# Patient Record
Sex: Female | Born: 1975 | Race: Black or African American | Hispanic: No | Marital: Married | Smoking: Never smoker
Health system: Southern US, Community
[De-identification: ages and names within clinical notes are randomized; demographics above are authoritative.]

## PROBLEM LIST (undated history)

## (undated) DIAGNOSIS — F32A Depression, unspecified: Secondary | ICD-10-CM

## (undated) DIAGNOSIS — K219 Gastro-esophageal reflux disease without esophagitis: Secondary | ICD-10-CM

## (undated) DIAGNOSIS — R569 Unspecified convulsions: Secondary | ICD-10-CM

## (undated) DIAGNOSIS — M1990 Unspecified osteoarthritis, unspecified site: Secondary | ICD-10-CM

## (undated) DIAGNOSIS — R519 Headache, unspecified: Secondary | ICD-10-CM

## (undated) DIAGNOSIS — F4310 Post-traumatic stress disorder, unspecified: Secondary | ICD-10-CM

## (undated) HISTORY — PX: CERVICAL SPINE SURGERY: SHX589

## (undated) HISTORY — PX: BACK SURGERY: SHX140

## (undated) HISTORY — PX: TONSILLECTOMY: SUR1361

## (undated) HISTORY — PX: LASIK: SHX215

## (undated) HISTORY — PX: TUBAL LIGATION: SHX77

## (undated) HISTORY — PX: COLONOSCOPY: SHX174

## (undated) HISTORY — PX: DILATION AND CURETTAGE OF UTERUS: SHX78

---

## 1998-07-13 ENCOUNTER — Emergency Department (HOSPITAL_COMMUNITY): Admission: EM | Admit: 1998-07-13 | Discharge: 1998-07-13 | Payer: Self-pay

## 1998-12-17 ENCOUNTER — Other Ambulatory Visit: Admission: RE | Admit: 1998-12-17 | Discharge: 1998-12-17 | Payer: Self-pay | Admitting: *Deleted

## 1999-10-28 ENCOUNTER — Ambulatory Visit (HOSPITAL_COMMUNITY): Admission: RE | Admit: 1999-10-28 | Discharge: 1999-10-28 | Payer: Self-pay | Admitting: *Deleted

## 2000-04-29 ENCOUNTER — Emergency Department (HOSPITAL_COMMUNITY): Admission: EM | Admit: 2000-04-29 | Discharge: 2000-04-29 | Payer: Self-pay | Admitting: Emergency Medicine

## 2000-04-29 ENCOUNTER — Encounter: Payer: Self-pay | Admitting: Emergency Medicine

## 2002-10-14 ENCOUNTER — Encounter: Payer: Self-pay | Admitting: Obstetrics and Gynecology

## 2002-10-14 ENCOUNTER — Ambulatory Visit (HOSPITAL_COMMUNITY): Admission: RE | Admit: 2002-10-14 | Discharge: 2002-10-14 | Payer: Self-pay | Admitting: Obstetrics and Gynecology

## 2002-12-27 ENCOUNTER — Encounter: Payer: Self-pay | Admitting: Obstetrics and Gynecology

## 2002-12-27 ENCOUNTER — Ambulatory Visit (HOSPITAL_COMMUNITY): Admission: RE | Admit: 2002-12-27 | Discharge: 2002-12-27 | Payer: Self-pay | Admitting: Obstetrics and Gynecology

## 2003-01-08 ENCOUNTER — Inpatient Hospital Stay (HOSPITAL_COMMUNITY): Admission: AD | Admit: 2003-01-08 | Discharge: 2003-01-08 | Payer: Self-pay | Admitting: Obstetrics and Gynecology

## 2003-01-17 ENCOUNTER — Inpatient Hospital Stay (HOSPITAL_COMMUNITY): Admission: AD | Admit: 2003-01-17 | Discharge: 2003-01-17 | Payer: Self-pay | Admitting: Obstetrics and Gynecology

## 2003-01-17 ENCOUNTER — Encounter: Payer: Self-pay | Admitting: Obstetrics and Gynecology

## 2003-01-19 ENCOUNTER — Inpatient Hospital Stay (HOSPITAL_COMMUNITY): Admission: AD | Admit: 2003-01-19 | Discharge: 2003-01-19 | Payer: Self-pay | Admitting: Obstetrics and Gynecology

## 2003-01-19 ENCOUNTER — Encounter: Payer: Self-pay | Admitting: Obstetrics and Gynecology

## 2003-02-14 ENCOUNTER — Inpatient Hospital Stay (HOSPITAL_COMMUNITY): Admission: AD | Admit: 2003-02-14 | Discharge: 2003-02-17 | Payer: Self-pay | Admitting: Obstetrics and Gynecology

## 2003-02-21 ENCOUNTER — Inpatient Hospital Stay (HOSPITAL_COMMUNITY): Admission: AD | Admit: 2003-02-21 | Discharge: 2003-02-21 | Payer: Self-pay | Admitting: Obstetrics and Gynecology

## 2003-03-28 ENCOUNTER — Other Ambulatory Visit: Admission: RE | Admit: 2003-03-28 | Discharge: 2003-03-28 | Payer: Self-pay | Admitting: Obstetrics and Gynecology

## 2004-05-04 ENCOUNTER — Other Ambulatory Visit: Admission: RE | Admit: 2004-05-04 | Discharge: 2004-05-04 | Payer: Self-pay | Admitting: Obstetrics and Gynecology

## 2013-07-18 ENCOUNTER — Other Ambulatory Visit: Payer: Self-pay | Admitting: Family

## 2013-09-27 ENCOUNTER — Other Ambulatory Visit (HOSPITAL_COMMUNITY): Payer: Self-pay | Admitting: Obstetrics

## 2013-09-27 DIAGNOSIS — N938 Other specified abnormal uterine and vaginal bleeding: Secondary | ICD-10-CM

## 2013-09-27 DIAGNOSIS — N949 Unspecified condition associated with female genital organs and menstrual cycle: Secondary | ICD-10-CM

## 2013-10-03 ENCOUNTER — Ambulatory Visit (HOSPITAL_COMMUNITY)
Admission: RE | Admit: 2013-10-03 | Discharge: 2013-10-03 | Disposition: A | Discharge status: Home / Self Care | Payer: Medicaid Other | Source: Ambulatory Visit | Attending: Obstetrics | Admitting: Obstetrics

## 2013-10-03 DIAGNOSIS — N925 Other specified irregular menstruation: Secondary | ICD-10-CM | POA: Insufficient documentation

## 2013-10-03 DIAGNOSIS — N938 Other specified abnormal uterine and vaginal bleeding: Secondary | ICD-10-CM | POA: Insufficient documentation

## 2013-10-03 DIAGNOSIS — D252 Subserosal leiomyoma of uterus: Secondary | ICD-10-CM | POA: Insufficient documentation

## 2013-10-03 DIAGNOSIS — D251 Intramural leiomyoma of uterus: Secondary | ICD-10-CM | POA: Insufficient documentation

## 2013-10-03 DIAGNOSIS — N949 Unspecified condition associated with female genital organs and menstrual cycle: Secondary | ICD-10-CM | POA: Insufficient documentation

## 2013-12-02 ENCOUNTER — Other Ambulatory Visit (HOSPITAL_COMMUNITY): Payer: Self-pay | Admitting: Obstetrics

## 2013-12-02 DIAGNOSIS — N939 Abnormal uterine and vaginal bleeding, unspecified: Secondary | ICD-10-CM

## 2013-12-05 ENCOUNTER — Ambulatory Visit (HOSPITAL_COMMUNITY)
Admission: RE | Admit: 2013-12-05 | Discharge: 2013-12-05 | Disposition: A | Discharge status: Home / Self Care | Payer: Medicaid Other | Source: Ambulatory Visit | Attending: Obstetrics | Admitting: Obstetrics

## 2013-12-05 DIAGNOSIS — D259 Leiomyoma of uterus, unspecified: Secondary | ICD-10-CM | POA: Diagnosis not present

## 2013-12-05 DIAGNOSIS — N949 Unspecified condition associated with female genital organs and menstrual cycle: Secondary | ICD-10-CM | POA: Diagnosis not present

## 2013-12-05 DIAGNOSIS — N925 Other specified irregular menstruation: Secondary | ICD-10-CM | POA: Insufficient documentation

## 2013-12-05 DIAGNOSIS — N938 Other specified abnormal uterine and vaginal bleeding: Secondary | ICD-10-CM | POA: Insufficient documentation

## 2013-12-05 DIAGNOSIS — N939 Abnormal uterine and vaginal bleeding, unspecified: Secondary | ICD-10-CM

## 2013-12-06 ENCOUNTER — Ambulatory Visit (HOSPITAL_COMMUNITY): Payer: Medicaid Other

## 2013-12-18 ENCOUNTER — Encounter (HOSPITAL_COMMUNITY): Payer: Self-pay | Admitting: Pharmacy Technician

## 2013-12-19 ENCOUNTER — Encounter (HOSPITAL_COMMUNITY): Payer: Self-pay | Admitting: *Deleted

## 2013-12-27 NOTE — H&P (Signed)
[name], [name] NO.:  [id number]  MEDICAL RECORD NO.:  [id number]  LOCATION:  [facility]IO                         FACILITY:  [facility]  PHYSICIAN:  Frederico Hamman, M.D.DATE OF BIRTH:  1975-08-06  DATE OF ADMISSION:  12/16/2013 DATE OF DISCHARGE:                             HISTORY & PHYSICAL   HISTORY OF PRESENT ILLNESS:  The patient is a 38 year old gravida 3, para 2-0-1-2, who has been having abnormal vaginal bleeding, and is in for a hysteroscopy, D and C, and a repeat HTA.  In the past, she had a HTA done.  She also has had a tubal ligation and neck and back surgery.  MEDICAL HISTORY:  She is allergic to Vicodin, latex, aspirin.  She takes Celexa 20 mg one a day.  SYSTEM REVIEW:  She had a sonohysterogram which was essentially negative.  PHYSICAL EXAMINATION:  GENERAL:  Well-developed female, in no distress. HEENT:  Negative. LUNGS:  Clear to P and A. HEART:  Regular rhythm.  No murmurs, no gallops. BREASTS:  Negative. ABDOMEN:  Negative. GU:  Uterus, normal size.  Negative adnexa.  Pap smear, negative. External genitalia, negative.  EXTREMITIES:  Negative.          ______________________________ Frederico Hamman, M.D.     BAM/MEDQ  D:  12/27/2013  T:  12/27/2013  Job:  [id number]

## 2014-01-01 ENCOUNTER — Encounter (HOSPITAL_COMMUNITY)
Admission: RE | Disposition: A | Discharge status: Home / Self Care | Payer: Self-pay | Source: Ambulatory Visit | Attending: Obstetrics

## 2014-01-01 ENCOUNTER — Ambulatory Visit (HOSPITAL_COMMUNITY)
Admission: RE | Admit: 2014-01-01 | Discharge: 2014-01-01 | Disposition: A | Discharge status: Home / Self Care | Payer: Medicaid Other | Source: Ambulatory Visit | Attending: Obstetrics | Admitting: Obstetrics

## 2014-01-01 ENCOUNTER — Encounter (HOSPITAL_COMMUNITY): Payer: Medicaid Other | Admitting: Anesthesiology

## 2014-01-01 ENCOUNTER — Inpatient Hospital Stay (HOSPITAL_COMMUNITY): Payer: Medicaid Other | Admitting: Anesthesiology

## 2014-01-01 ENCOUNTER — Encounter (HOSPITAL_COMMUNITY): Payer: Self-pay | Admitting: Anesthesiology

## 2014-01-01 DIAGNOSIS — N949 Unspecified condition associated with female genital organs and menstrual cycle: Secondary | ICD-10-CM | POA: Insufficient documentation

## 2014-01-01 DIAGNOSIS — Z885 Allergy status to narcotic agent status: Secondary | ICD-10-CM | POA: Diagnosis not present

## 2014-01-01 DIAGNOSIS — Z9851 Tubal ligation status: Secondary | ICD-10-CM | POA: Insufficient documentation

## 2014-01-01 DIAGNOSIS — Z91040 Latex allergy status: Secondary | ICD-10-CM | POA: Insufficient documentation

## 2014-01-01 DIAGNOSIS — N938 Other specified abnormal uterine and vaginal bleeding: Secondary | ICD-10-CM | POA: Diagnosis not present

## 2014-01-01 DIAGNOSIS — N925 Other specified irregular menstruation: Secondary | ICD-10-CM | POA: Diagnosis present

## 2014-01-01 HISTORY — DX: Unspecified convulsions: R56.9

## 2014-01-01 HISTORY — PX: DILITATION & CURRETTAGE/HYSTROSCOPY WITH HYDROTHERMAL ABLATION: SHX5570

## 2014-01-01 LAB — CBC
HCT: 36 % (ref 36.0–46.0)
HEMOGLOBIN: 12.7 g/dL (ref 12.0–15.0)
MCH: 32.3 pg (ref 26.0–34.0)
MCHC: 35.3 g/dL (ref 30.0–36.0)
MCV: 91.6 fL (ref 78.0–100.0)
Platelets: 218 10*3/uL (ref 150–400)
RBC: 3.93 MIL/uL (ref 3.87–5.11)
RDW: 13.1 % (ref 11.5–15.5)
WBC: 4.2 10*3/uL (ref 4.0–10.5)

## 2014-01-01 SURGERY — DILATATION & CURETTAGE/HYSTEROSCOPY WITH HYDROTHERMAL ABLATION
Anesthesia: General | Site: Uterus

## 2014-01-01 MED ORDER — PROPOFOL 10 MG/ML IV BOLUS
INTRAVENOUS | Status: DC | PRN
  Administered 2014-01-01: 200 mg via INTRAVENOUS

## 2014-01-01 MED ORDER — SCOPOLAMINE 1 MG/3DAYS TD PT72
MEDICATED_PATCH | TRANSDERMAL | Status: AC
  Administered 2014-01-01: 1.5 mg via TRANSDERMAL
  Filled 2014-01-01: qty 1

## 2014-01-01 MED ORDER — OXYCODONE-ACETAMINOPHEN 5-325 MG PO TABS
1.0000 | ORAL_TABLET | ORAL | Status: DC | PRN
  Administered 2014-01-01: 1 via ORAL

## 2014-01-01 MED ORDER — LACTATED RINGERS IV SOLN
INTRAVENOUS | Status: DC
  Administered 2014-01-01: 08:00:00 via INTRAVENOUS

## 2014-01-01 MED ORDER — LIDOCAINE HCL (CARDIAC) 20 MG/ML IV SOLN
INTRAVENOUS | Status: AC
  Filled 2014-01-01: qty 5

## 2014-01-01 MED ORDER — ONDANSETRON HCL 4 MG/2ML IJ SOLN
INTRAMUSCULAR | Status: AC
  Filled 2014-01-01: qty 2

## 2014-01-01 MED ORDER — KETOROLAC TROMETHAMINE 30 MG/ML IJ SOLN
INTRAMUSCULAR | Status: AC
  Filled 2014-01-01: qty 1

## 2014-01-01 MED ORDER — SCOPOLAMINE 1 MG/3DAYS TD PT72
1.0000 | MEDICATED_PATCH | Freq: Once | TRANSDERMAL | Status: DC
  Administered 2014-01-01: 1.5 mg via TRANSDERMAL

## 2014-01-01 MED ORDER — FENTANYL CITRATE 0.05 MG/ML IJ SOLN
INTRAMUSCULAR | Status: AC
  Filled 2014-01-01: qty 5

## 2014-01-01 MED ORDER — DEXAMETHASONE SODIUM PHOSPHATE 10 MG/ML IJ SOLN
INTRAMUSCULAR | Status: DC | PRN
  Administered 2014-01-01: 4 mg via INTRAVENOUS

## 2014-01-01 MED ORDER — SODIUM CHLORIDE 0.9 % IR SOLN
Status: DC | PRN
  Administered 2014-01-01: 3000 mL

## 2014-01-01 MED ORDER — ONDANSETRON HCL 4 MG/2ML IJ SOLN: 4.0000 mg | Freq: Once | INTRAMUSCULAR | Status: DC | PRN

## 2014-01-01 MED ORDER — MEPERIDINE HCL 25 MG/ML IJ SOLN: 6.2500 mg | INTRAMUSCULAR | Status: DC | PRN

## 2014-01-01 MED ORDER — KETOROLAC TROMETHAMINE 30 MG/ML IJ SOLN: 15.0000 mg | Freq: Once | INTRAMUSCULAR | Status: DC | PRN

## 2014-01-01 MED ORDER — DEXAMETHASONE SODIUM PHOSPHATE 10 MG/ML IJ SOLN
INTRAMUSCULAR | Status: AC
  Filled 2014-01-01: qty 1

## 2014-01-01 MED ORDER — KETOROLAC TROMETHAMINE 30 MG/ML IJ SOLN
INTRAMUSCULAR | Status: DC | PRN
  Administered 2014-01-01: 30 mg via INTRAVENOUS

## 2014-01-01 MED ORDER — MIDAZOLAM HCL 5 MG/5ML IJ SOLN
INTRAMUSCULAR | Status: DC | PRN
  Administered 2014-01-01: 2 mg via INTRAVENOUS

## 2014-01-01 MED ORDER — LIDOCAINE HCL 1 % IJ SOLN
INTRAMUSCULAR | Status: AC
  Filled 2014-01-01: qty 20

## 2014-01-01 MED ORDER — ONDANSETRON HCL 4 MG/2ML IJ SOLN
INTRAMUSCULAR | Status: DC | PRN
  Administered 2014-01-01: 4 mg via INTRAVENOUS

## 2014-01-01 MED ORDER — LIDOCAINE HCL (CARDIAC) 20 MG/ML IV SOLN
INTRAVENOUS | Status: DC | PRN
  Administered 2014-01-01: 50 mg via INTRAVENOUS

## 2014-01-01 MED ORDER — LIDOCAINE HCL 1 % IJ SOLN
INTRAMUSCULAR | Status: DC | PRN
  Administered 2014-01-01: 10 mL

## 2014-01-01 MED ORDER — FENTANYL CITRATE 0.05 MG/ML IJ SOLN
INTRAMUSCULAR | Status: DC | PRN
  Administered 2014-01-01 (×2): 100 ug via INTRAVENOUS
  Administered 2014-01-01: 50 ug via INTRAVENOUS

## 2014-01-01 MED ORDER — FENTANYL CITRATE 0.05 MG/ML IJ SOLN
25.0000 ug | INTRAMUSCULAR | Status: DC | PRN
  Administered 2014-01-01 (×2): 50 ug via INTRAVENOUS

## 2014-01-01 MED ORDER — OXYCODONE-ACETAMINOPHEN 5-325 MG PO TABS
ORAL_TABLET | ORAL | Status: AC
  Administered 2014-01-01: 1 via ORAL
  Filled 2014-01-01: qty 1

## 2014-01-01 MED ORDER — MIDAZOLAM HCL 2 MG/2ML IJ SOLN
INTRAMUSCULAR | Status: AC
  Filled 2014-01-01: qty 2

## 2014-01-01 MED ORDER — FENTANYL CITRATE 0.05 MG/ML IJ SOLN
INTRAMUSCULAR | Status: AC
  Administered 2014-01-01: 50 ug via INTRAVENOUS
  Filled 2014-01-01: qty 2

## 2014-01-01 MED ORDER — PROPOFOL 10 MG/ML IV EMUL
INTRAVENOUS | Status: AC
  Filled 2014-01-01: qty 20

## 2014-01-01 MED ORDER — DEXAMETHASONE SODIUM PHOSPHATE 4 MG/ML IJ SOLN
INTRAMUSCULAR | Status: AC
  Filled 2014-01-01: qty 1

## 2014-01-01 SURGICAL SUPPLY — 16 items
CATH FOLEY 2W 5CC 18FR LF (CATHETERS) ×2 IMPLANT
CLOTH BEACON ORANGE TIMEOUT ST (SAFETY) ×3 IMPLANT
CONTAINER PREFILL 10% NBF 60ML (FORM) ×4 IMPLANT
DECANTER SPIKE VIAL GLASS SM (MISCELLANEOUS) ×2 IMPLANT
DRAPE HYSTEROSCOPY (DRAPE) ×3 IMPLANT
DRSG TELFA 3X8 NADH (GAUZE/BANDAGES/DRESSINGS) ×3 IMPLANT
GLOVE SURG SS PI 7.0 STRL IVOR (GLOVE) ×10 IMPLANT
GLOVE SURG SS PI 8.5 STRL IVOR (GLOVE) ×12
GLOVE SURG SS PI 8.5 STRL STRW (GLOVE) IMPLANT
GOWN STRL REUS W/TWL 2XL LVL3 (GOWN DISPOSABLE) ×5 IMPLANT
GOWN STRL REUS W/TWL LRG LVL3 (GOWN DISPOSABLE) ×3 IMPLANT
PACK VAGINAL MINOR WOMEN LF (CUSTOM PROCEDURE TRAY) ×3 IMPLANT
PAD DRESSING TELFA 3X8 NADH (GAUZE/BANDAGES/DRESSINGS) ×1 IMPLANT
PAD OB MATERNITY 4.3X12.25 (PERSONAL CARE ITEMS) ×5 IMPLANT
SET GENESYS HTA PROCERVA (MISCELLANEOUS) ×3 IMPLANT
TOWEL OR 17X24 6PK STRL BLUE (TOWEL DISPOSABLE) ×6 IMPLANT

## 2014-01-01 NOTE — H&P (Signed)
  There has been no change in her history and physical since the  Original  dictation

## 2014-01-01 NOTE — Anesthesia Preprocedure Evaluation (Signed)
Anesthesia Evaluation  Patient identified by MRN, date of birth, ID band Patient awake    Reviewed: Allergy & Precautions, H&P , NPO status , Patient's Chart, lab work & pertinent test results  Airway Mallampati: I TM Distance: >3 FB Neck ROM: full    Dental no notable dental hx. (+) Teeth Intact   Pulmonary neg pulmonary ROS,    Pulmonary exam normal       Cardiovascular negative cardio ROS      Neuro/Psych negative psych ROS   GI/Hepatic negative GI ROS, Neg liver ROS,   Endo/Other  negative endocrine ROS  Renal/GU negative Renal ROS     Musculoskeletal   Abdominal Normal abdominal exam  (+)   Peds  Hematology negative hematology ROS (+)   Anesthesia Other Findings   Reproductive/Obstetrics negative OB ROS                           Anesthesia Physical Anesthesia Plan  ASA: II  Anesthesia Plan: General   Post-op Pain Management:    Induction: Intravenous  Airway Management Planned: LMA  Additional Equipment:   Intra-op Plan:   Post-operative Plan:   Informed Consent: I have reviewed the patients History and Physical, chart, labs and discussed the procedure including the risks, benefits and alternatives for the proposed anesthesia with the patient or authorized representative who has indicated his/her understanding and acceptance.     Plan Discussed with: CRNA and Surgeon  Anesthesia Plan Comments:         Anesthesia Quick Evaluation

## 2014-01-01 NOTE — Op Note (Signed)
Preop diagnosis DOB Postop diagnosis the same Procedure hysteroscopy D&C and feel HTA Anesthesia Gen. Surgeon Dr. Gracy Racer Procedure  Under  general anesthesia patient in the lithotomy position perineum and vagina prepped and draped  Bladder emptied  a straight catheter speculum placed in the vagina cervix injected with 10 cc 1% Xylocaine anterior lip of the cervix grasped with tenaculum cervix dilated to #27 Kennon Rounds and the cavity sounded 12 cm hysteroscope was inserted and the cavity was noted to be thin and clean hysteroscope removed sharp curettage done small amount of tissue obtained today hysteroscope was reinserted to begin the HTA but it was noted that both ostia were the unusually large  on attempting to the HTA the fluid loss or rate was   100 cc which is abnormally high and it was decided that the procedure could not be performed because of the fluid  Loss  rate procedure terminated patient taken to recovery room in good condition

## 2014-01-01 NOTE — Anesthesia Postprocedure Evaluation (Signed)
Anesthesia Post Note  Patient: Abigail Reynolds  Procedure(s) Performed: Procedure(s) (LRB): DILATATION & CURETTAGE/HYSTEROSCOPY WITH attempted HYDROTHERMAL ABLATION (N/A)  Anesthesia type: General  Patient location: PACU  Post pain: Pain level controlled  Post assessment: Post-op Vital signs reviewed  Last Vitals:  Filed Vitals:   01/01/14 1050  BP:   Pulse: 82  Temp: 36.6 C  Resp: 18    Post vital signs: Reviewed  Level of consciousness: sedated  Complications: No apparent anesthesia complications

## 2014-01-01 NOTE — Transfer of Care (Signed)
Immediate Anesthesia Transfer of Care Note  Patient: Abigail Reynolds  Procedure(s) Performed: Procedure(s): DILATATION & CURETTAGE/HYSTEROSCOPY WITH attempted HYDROTHERMAL ABLATION (N/A)  Patient Location: PACU  Anesthesia Type:General  Level of Consciousness: awake, alert , oriented and patient cooperative  Airway & Oxygen Therapy: Patient Spontanous Breathing and Patient connected to nasal cannula oxygen  Post-op Assessment: Report given to PACU RN, Post -op Vital signs reviewed and stable and Patient moving all extremities X 4  Post vital signs: Reviewed and stable  Complications: No apparent anesthesia complications

## 2014-01-01 NOTE — Discharge Instructions (Signed)
D&C Hysterosocpy Care After Read the instructions below. Refer to this sheet in the next few weeks. These instructions provide you with general information on caring for yourself after you leave the hospital. Your caregiver may also give you specific instructions.  D&C or vacuum curettage is a minor operation. A D&C involves the stretching (dilatation) of the cervix and scraping (curettage) of the inside lining of the uterus. A vacuum curettage gently sucks out the lining and tissue in the uterus with a tube. You may have light cramping and bleeding for a couple of days to two weeks after the procedure. This procedure may be done in a hospital, outpatient clinic, or doctor's office. You may be given a drug to make you sleep (general anesthetic) or a drug that numbs the area (local anesthetic) in and around the cervix. HOME CARE INSTRUCTIONS  Do not drive for 24 hours.   Wait one week before returning to strenuous activities.   Take your temperature two times a day for 4 days and write it down. Provide these temperatures to your caregiver if they are abnormal (above 98.6 F or 37.0 C).   Avoid long periods of standing, and do no heavy lifting (more than 10 pounds), pushing or pulling.   Limit stair climbing to once or twice a day.   Take rest periods often.   You may resume your usual diet.   Drink plenty of fluids (6-8 glasses a day).   You should return to your usual bowel function. If constipation should occur, you may:   Take a mild laxative with permission from your caregiver.   Add fruit and bran to your diet.   Drink more fluids. This helps with constipation.   Take showers instead of baths until your caregiver gives you permission to take baths.   Do not go swimming or use a hot tub until your caregiver gives you permission.   Try to have someone with you or available for you the first 24 to 48 hours, especially if you had a general anesthetic.   Do not douche, use  tampons, or have intercourse until after your follow-up appointment, or when your caregiver approves.   Only take over-the-counter or prescription medicines for pain, discomfort, or fever as directed by your caregiver. Do not take aspirin. It can cause bleeding.   If a prescription was given, follow your caregiver's directions. You may be given a medicine that kills germs (antibiotic) to prevent an infection.   Keep all your follow-up appointments recommended by your caregiver.  SEEK MEDICAL CARE IF:  You have increasing cramps or pain not relieved with medication.   You develop belly (abdominal) pain which does not seem to be related to the same area of earlier cramping and pain.   You feel dizzy or feel like fainting.   You have bad smelling vaginal discharge.   You develop a rash.   You develop a reaction or allergy to your medication.  SEEK IMMEDIATE MEDICAL CARE IF:  Bleeding is heavier than a normal menstrual period.   You have an oral temperature above 100.6, not controlled by medicine.   You develop chest pain.   You develop shortness of breath.   You pass out.   You develop pain in your shoulder strap area.   You develop heavy vaginal bleeding with or without blood clots.  MAKE SURE YOU:   Understand these instructions.   Will watch your condition.   Will get help right away if you are  not doing well or get worse.  UPDATED HEALTH PRACTICES  A PAP smear is done to screen for cervical cancer.   The first PAP smear should be done at age 47.   Between ages 52 and 56, PAP smears are repeated every 2 years.   Beginning at age 103, you are advised to have a PAP smear every 3 years as long as your past 3 PAP smears have been normal.   Some women have medical problems that increase the chance of getting cervical cancer. Talk to your caregiver about these problems. It is especially important to talk to your caregiver if a new problem develops soon after your last PAP  smear. In these cases, your caregiver may recommend more frequent screening and Pap smears.   The above recommendations are the same for women who have or have not gotten the vaccine for HPV (Human Papillomavirus).   If you had a hysterectomy for a problem that was not a cancer or a condition that could lead to cancer, then you no longer need Pap smears.   If you are between ages 65 and 25, and you have had normal Pap smears going back 10 years, you no longer need Pap smears.   If you have had past treatment for cervical cancer or a condition that could lead to cancer, you need Pap smears and screening for cancer for at least 20 years after your treatment.   Continue monthly self-breast examinations. Your caregiver can provide information and instructions for self-breast examination.  Document Released: 05/13/2000 Document Re-Released: 11/03/2009 [facility] Patient Information 2011 [place].

## 2014-01-02 ENCOUNTER — Encounter (HOSPITAL_COMMUNITY): Payer: Self-pay | Admitting: Obstetrics

## 2014-10-09 ENCOUNTER — Emergency Department (HOSPITAL_COMMUNITY)
Admission: EM | Admit: 2014-10-09 | Discharge: 2014-10-09 | Disposition: A | Discharge status: Home / Self Care | Payer: Self-pay | Attending: Emergency Medicine | Admitting: Emergency Medicine

## 2014-10-09 ENCOUNTER — Emergency Department (HOSPITAL_COMMUNITY)

## 2014-10-09 ENCOUNTER — Encounter (HOSPITAL_COMMUNITY): Payer: Self-pay

## 2014-10-09 DIAGNOSIS — Z3202 Encounter for pregnancy test, result negative: Secondary | ICD-10-CM | POA: Insufficient documentation

## 2014-10-09 DIAGNOSIS — R531 Weakness: Secondary | ICD-10-CM | POA: Insufficient documentation

## 2014-10-09 DIAGNOSIS — M5416 Radiculopathy, lumbar region: Secondary | ICD-10-CM | POA: Insufficient documentation

## 2014-10-09 DIAGNOSIS — Z9889 Other specified postprocedural states: Secondary | ICD-10-CM | POA: Insufficient documentation

## 2014-10-09 DIAGNOSIS — A599 Trichomoniasis, unspecified: Secondary | ICD-10-CM | POA: Insufficient documentation

## 2014-10-09 DIAGNOSIS — Z8669 Personal history of other diseases of the nervous system and sense organs: Secondary | ICD-10-CM | POA: Insufficient documentation

## 2014-10-09 DIAGNOSIS — Z91040 Latex allergy status: Secondary | ICD-10-CM | POA: Insufficient documentation

## 2014-10-09 LAB — URINALYSIS, ROUTINE W REFLEX MICROSCOPIC
Bilirubin Urine: NEGATIVE
GLUCOSE, UA: NEGATIVE mg/dL
HGB URINE DIPSTICK: NEGATIVE
Ketones, ur: NEGATIVE mg/dL
NITRITE: NEGATIVE
Protein, ur: NEGATIVE mg/dL
SPECIFIC GRAVITY, URINE: 1.005 (ref 1.005–1.030)
UROBILINOGEN UA: 0.2 mg/dL (ref 0.0–1.0)
pH: 6.5 (ref 5.0–8.0)

## 2014-10-09 LAB — URINE MICROSCOPIC-ADD ON

## 2014-10-09 LAB — PREGNANCY, URINE: PREG TEST UR: NEGATIVE

## 2014-10-09 MED ORDER — METHYLPREDNISOLONE 4 MG PO TBPK: ORAL_TABLET | ORAL | Status: DC

## 2014-10-09 MED ORDER — HYDROMORPHONE HCL 1 MG/ML IJ SOLN
1.0000 mg | Freq: Once | INTRAMUSCULAR | Status: AC
  Administered 2014-10-09: 1 mg via INTRAMUSCULAR
  Filled 2014-10-09: qty 1

## 2014-10-09 MED ORDER — OXYCODONE-ACETAMINOPHEN 5-325 MG PO TABS: 1.0000 | ORAL_TABLET | Freq: Four times a day (QID) | ORAL | Status: DC | PRN

## 2014-10-09 MED ORDER — METRONIDAZOLE 500 MG PO TABS
2000.0000 mg | ORAL_TABLET | Freq: Once | ORAL | Status: AC
  Administered 2014-10-09: 2000 mg via ORAL
  Filled 2014-10-09: qty 4

## 2014-10-09 MED ORDER — OXYCODONE-ACETAMINOPHEN 5-325 MG PO TABS: 1.0000 | ORAL_TABLET | Freq: Once | ORAL | Status: DC

## 2014-10-09 NOTE — ED Provider Notes (Signed)
CSN: [id number]     Arrival date & time 10/09/14  1117 History   First MD Initiated Contact with Patient 10/09/14 1145     Chief Complaint  Patient presents with  . Back Pain     Patient is a 39 y.o. female presenting with back pain. The history is provided by the patient. No language interpreter was used.  Back Pain  Ms. Stehr presents for evaluation of low back pain.  She has a hx/o L4-L5 disc surgery two years ago.  About 4-5 days ago she developed increased pain in her left low back, radiating down the back of her left leg.  Pain is severe and constant in nature.  A day ago her left leg buckled and the pain increased in intensity and now she has numbness in her left leg posteriorly to the calf.  She denies any fevers, vomiting, dysuria, urinary retention, incontinence.  She has no medical problems.  Sxs are severe, constant, worsening.  Unable to bear weight due to pain.    Past Medical History  Diagnosis Date  . 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 Back surgery      L4 AND L5 2014  . Dilation and curettage of uterus    . Cervical spine surgery      C 4 AND C5  . Dilitation & currettage/hystroscopy with hydrothermal ablation N/A 01/01/2014    Procedure: DILATATION & CURETTAGE/HYSTEROSCOPY WITH attempted HYDROTHERMAL ABLATION;  Surgeon: Frederico Hamman, MD;  Location: [facility];  Service: Gynecology;  Laterality: N/A;   History reviewed. No pertinent family history. History  Substance Use Topics  . Smoking status: Never Smoker   . Smokeless tobacco: Never Used  . Alcohol Use: Yes   OB History    No data available     Review of Systems  Musculoskeletal: Positive for back pain.  All other systems reviewed and are negative.     Allergies  Latex and Vicodin  Home Medications   Prior to Admission medications   Not on File   BP 130/79 mmHg  Pulse 82  Temp(Src) 97.8 F (36.6 C) (Oral)  Resp 16   SpO2 99%  LMP 09/29/2014 Physical Exam  Constitutional: She is oriented to person, place, and time. She appears well-developed and well-nourished.  Uncomfortable appearing  HENT:  Head: Normocephalic and atraumatic.  Cardiovascular: Normal rate and regular rhythm.   No murmur heard. Pulmonary/Chest: Effort normal and breath sounds normal. No respiratory distress.  Abdominal: Soft. There is no tenderness. There is no rebound and no guarding.  Musculoskeletal:  TTP over left lower back/SI region.    Neurological: She is alert and oriented to person, place, and time.  5/5 strength in BLE, no ankle clonus.  2+ patellar reflexes bilaterally.   Skin: Skin is warm and dry.  Psychiatric: She has a normal mood and affect. Her behavior is normal.  Nursing note and vitals reviewed.   ED Course  Procedures (including critical care time) Labs Review Labs Reviewed  URINALYSIS, ROUTINE W REFLEX MICROSCOPIC - Abnormal; Notable for the following:    APPearance CLOUDY (*)    Leukocytes, UA SMALL (*)    All other components within normal limits  URINE MICROSCOPIC-ADD ON - Abnormal; Notable for the following:    Squamous Epithelial / LPF FEW (*)    Bacteria, UA FEW (*)    All other components within normal limits  PREGNANCY, URINE    Imaging Review Mr Lumbar Spine Wo Contrast  10/09/2014   CLINICAL DATA:  39 year old female with lumbar back pain and prior L4-L5 surgery. Four days of increased pain with left foot tingling. Weakness. Subsequent encounter.  EXAM: MRI LUMBAR SPINE WITHOUT CONTRAST  TECHNIQUE: Multiplanar, multisequence MR imaging of the lumbar spine was performed. No intravenous contrast was administered.  COMPARISON:  None.  FINDINGS: Transitional lumbosacral anatomy. For the purposes of this report the lowest visible ribs are designated T12 and this results in a sacralized L5 level with a vestigial L5-S1 disc space. There appear to be postoperative changes in the soft tissues  overlying what is then designated the L4-L5 level. Correlation with radiographs is recommended prior to any operative intervention.  Mild to moderate left side pedicle and posterior element marrow edema at L4-L5 (series 5, image 11). Facet marrow edema also on the right side at the L4-L5 level (image 3). No other acute osseous abnormality. Vertebral height and alignment within normal limits.  Visualized lower thoracic spinal cord is normal with conus medularis at L1-L2.  Negative visualized abdominal viscera. Partially visible left uterine T2 hypo intense fibroid (approximating 25 mm diameter.  T10-T11:  Mild disc desiccation.  T11-T12:  Mild facet hypertrophy.  T12-L1:  Negative.  L1-L2:  Mild facet hypertrophy.  L2-L3:  Negative.  L3-L4: Minimal disc bulge. Mild to moderate facet hypertrophy. Mild left greater than right L3 foraminal stenosis.  L4-L5: Disc desiccation. Circumferential disc bulge with broad-based posterior component. Tiny right paracentral annular fissure. Moderate to severe facet hypertrophy. Bilateral synovial cysts on series 6, images 21 and 22. On the left 8 4 mm cyst projects toward the exiting left L4 nerve (series 4, image 12). On the right 1 of the cysts is posteriorly located and should not cause neural compromise. The second is projecting anteriorly from the facet joint towards the exiting right L4 nerve (series 4, image 3). No spinal or lateral recess stenosis at this level. Multifactorial severe left and mild right L4 foraminal stenosis Prior ligament ectomy suspected. L5 spina bifida occulta also noted.  L5-S1:  Transitional level.  Mostly sacralized L5.  No stenosis.  IMPRESSION: 1. Transitional anatomy. Correlation with radiographs is recommended prior to any operative intervention. 2. Mild disc and severe facet degeneration at L4-L5. Left greater than right posterior element marrow edema associated. Small bilateral synovial cysts contribute to severe left and mild right L4 foraminal  stenosis.   Electronically Signed   By: Genevie Ann M.D.   On: 10/09/2014 13:39     EKG Interpretation None      MDM   Final diagnoses:  Weakness  Lumbar radiculopathy, acute  Infection due to trichomonas    Pt here for evaluation of low back pain.  Hx and presentation not c/w epidural abscess, renal colic.  MRI obtained given sensory deficit in LLE, unclear weakness vs pain that limited ROM on LLE.  MRI without acute cord injury.  Discussed neurosurgery follow up.  Treating with steroids, pain control, return precautions.      Quintella Reichert, MD 10/09/14 [phone]

## 2014-10-09 NOTE — Discharge Instructions (Signed)
Lumbosacral Radiculopathy Lumbosacral radiculopathy is a pinched nerve or nerves in the low back (lumbosacral area). When this happens you may have weakness in your legs and may not be able to stand on your toes. You may have pain going down into your legs. There may be difficulties with walking normally. There are many causes of this problem. Sometimes this may happen from an injury, or simply from arthritis or boney problems. It may also be caused by other illnesses such as diabetes. If there is no improvement after treatment, further studies may be done to find the exact cause. DIAGNOSIS  X-rays may be needed if the problems become long standing. Electromyograms may be done. This study is one in which the working of nerves and muscles is studied. HOME CARE INSTRUCTIONS   Applications of ice packs may be helpful. Ice can be used in a plastic bag with a towel around it to prevent frostbite to skin. This may be used every 2 hours for 20 to 30 minutes, or as needed, while awake, or as directed by your caregiver.  Only take over-the-counter or prescription medicines for pain, discomfort, or fever as directed by your caregiver.  If physical therapy was prescribed, follow your caregiver's directions. SEEK IMMEDIATE MEDICAL CARE IF:   You have pain not controlled with medications.  You seem to be getting worse rather than better.  You develop increasing weakness in your legs.  You develop loss of bowel or bladder control.  You have difficulty with walking or balance, or develop clumsiness in the use of your legs.  You have a fever. MAKE SURE YOU:   Understand these instructions.  Will watch your condition.  Will get help right away if you are not doing well or get worse. Document Released: 05/16/2005 Document Revised: 08/08/2011 Document Reviewed: 01/04/2008 [facility] Patient Information 2015 [place], [place]. This information is not intended to replace advice given to you by your health  care provider. Make sure you discuss any questions you have with your health care provider.  Trichomoniasis Trichomoniasis is an infection caused by an organism called Trichomonas. The infection can affect both women and men. In women, the outer female genitalia and the vagina are affected. In men, the penis is mainly affected, but the prostate and other reproductive organs can also be involved. Trichomoniasis is a sexually transmitted infection (STI) and is most often passed to another person through sexual contact.  RISK FACTORS  Having unprotected sexual intercourse.  Having sexual intercourse with an infected partner. SIGNS AND SYMPTOMS  Symptoms of trichomoniasis in women include:  Abnormal gray-green frothy vaginal discharge.  Itching and irritation of the vagina.  Itching and irritation of the area outside the vagina. Symptoms of trichomoniasis in men include:   Penile discharge with or without pain.  Pain during urination. This results from inflammation of the urethra. DIAGNOSIS  Trichomoniasis may be found during a Pap test or physical exam. Your health care provider may use one of the following methods to help diagnose this infection:  Examining vaginal discharge under a microscope. For men, urethral discharge would be examined.  Testing the pH of the vagina with a test tape.  Using a vaginal swab test that checks for the Trichomonas organism. A test is available that provides results within a few minutes.  Doing a culture test for the organism. This is not usually needed. TREATMENT   You may be given medicine to fight the infection. Women should inform their health care provider if they could  be or are pregnant. Some medicines used to treat the infection should not be taken during pregnancy.  Your health care provider may recommend over-the-counter medicines or creams to decrease itching or irritation.  Your sexual partner will need to be treated if infected. HOME  CARE INSTRUCTIONS   Take medicines only as directed by your health care provider.  Take over-the-counter medicine for itching or irritation as directed by your health care provider.  Do not have sexual intercourse while you have the infection.  Women should not douche or wear tampons while they have the infection.  Discuss your infection with your partner. Your partner may have gotten the infection from you, or you may have gotten it from your partner.  Have your sex partner get examined and treated if necessary.  Practice safe, informed, and protected sex.  See your health care provider for other STI testing. SEEK MEDICAL CARE IF:   You still have symptoms after you finish your medicine.  You develop abdominal pain.  You have pain when you urinate.  You have bleeding after sexual intercourse.  You develop a rash.  Your medicine makes you sick or makes you throw up (vomit). MAKE SURE YOU:  Understand these instructions.  Will watch your condition.  Will get help right away if you are not doing well or get worse. Document Released: 11/09/2000 Document Revised: 09/30/2013 Document Reviewed: 02/25/2013 [facility] Patient Information 2015 [place], [place]. This information is not intended to replace advice given to you by your health care provider. Make sure you discuss any questions you have with your health care provider.

## 2014-10-09 NOTE — ED Notes (Signed)
Back pain chronic d/t L4/L5 surgery.  Pt states since Sunday, pain has increased.  Pt states left foot is tingling.  No injury noted.

## 2014-12-19 ENCOUNTER — Telehealth: Payer: Self-pay | Admitting: *Deleted

## 2014-12-19 DIAGNOSIS — M545 Low back pain: Secondary | ICD-10-CM

## 2014-12-19 NOTE — Telephone Encounter (Signed)
Per Dr Sherren Mocha patient can have a referral for her back pain

## 2016-02-26 LAB — PROCEDURE REPORT - SCANNED: Pap: NEGATIVE

## 2016-11-02 ENCOUNTER — Other Ambulatory Visit: Payer: Self-pay | Admitting: Internal Medicine

## 2016-11-02 ENCOUNTER — Ambulatory Visit
Admission: RE | Admit: 2016-11-02 | Discharge: 2016-11-02 | Disposition: A | Discharge status: Home / Self Care | Payer: No Typology Code available for payment source | Source: Ambulatory Visit | Attending: Internal Medicine | Admitting: Internal Medicine

## 2016-11-02 DIAGNOSIS — M79672 Pain in left foot: Secondary | ICD-10-CM

## 2016-12-11 ENCOUNTER — Ambulatory Visit (HOSPITAL_COMMUNITY)
Admission: EM | Admit: 2016-12-11 | Discharge: 2016-12-11 | Disposition: A | Discharge status: Home / Self Care | Payer: No Typology Code available for payment source | Attending: Internal Medicine | Admitting: Internal Medicine

## 2016-12-11 ENCOUNTER — Encounter (HOSPITAL_COMMUNITY): Payer: Self-pay | Admitting: Emergency Medicine

## 2016-12-11 ENCOUNTER — Ambulatory Visit (INDEPENDENT_AMBULATORY_CARE_PROVIDER_SITE_OTHER): Payer: No Typology Code available for payment source

## 2016-12-11 DIAGNOSIS — M79642 Pain in left hand: Secondary | ICD-10-CM

## 2016-12-11 DIAGNOSIS — W19XXXA Unspecified fall, initial encounter: Secondary | ICD-10-CM

## 2016-12-11 MED ORDER — BACITRACIN ZINC 500 UNIT/GM EX OINT
TOPICAL_OINTMENT | CUTANEOUS | Status: AC
  Filled 2016-12-11: qty 0.9

## 2016-12-11 MED ORDER — CYCLOBENZAPRINE HCL 5 MG PO TABS: 5.0000 mg | ORAL_TABLET | Freq: Every day | ORAL | 0 refills | Status: DC

## 2016-12-11 NOTE — ED Triage Notes (Signed)
Pt fell yesterday and injured her left hand.  She has swelling in the hand and states the pain is mainly on the top of her hand and on the medial side of her hand.  Pt also has a large abrasion on her left knee.  She states she has some swelling in her left face.

## 2016-12-11 NOTE — Discharge Instructions (Signed)
Your xray was negative for fracture and dislocation. Start Meloxicam as directed. Flexeril as needed for muscle spasms/cramps. Ice/heat compress and elevation of injured site. Wrist brace as needed for activity. Monitor for worsening of symptoms, N/V, photophobia/phonophobia, to follow up here or with PCP for reevaluation.

## 2016-12-11 NOTE — ED Provider Notes (Signed)
CSN: [id number]     Arrival date & time 12/11/16  1853 History   None    Chief Complaint  Patient presents with  . Fall  . Hand Pain    left   (Consider location/radiation/quality/duration/timing/severity/associated sxs/prior Treatment) 41 year old female comes in with 2 day history of left hand/wrist pain after falling yesterday. Patient states she tripped and fell, does not recall hand placement when falling. She has an abrasion of her left knee with some swelling. She also has some pain on her cheek from the fall. Denies LOC, head injury. Denies fever, chills, night sweats. Pain and swelling of the hand and wrist, but is able to move. Some numbness and tingling. Has tried some ibuprofen with some relief.      Past Medical History:  Diagnosis Date  . Seizures ([place])    FOCAL SEIZURES  NO IN 2 YEARS   Past Surgical History:  Procedure Laterality Date  . BACK SURGERY     L4 AND L5 2014  . CERVICAL SPINE SURGERY     C 4 AND C5  . DILATION AND CURETTAGE OF UTERUS    . DILITATION & CURRETTAGE/HYSTROSCOPY WITH HYDROTHERMAL ABLATION N/A 01/01/2014   Procedure: DILATATION & CURETTAGE/HYSTEROSCOPY WITH attempted HYDROTHERMAL ABLATION;  Surgeon: Frederico Hamman, MD;  Location: [facility];  Service: Gynecology;  Laterality: N/A;   History reviewed. No pertinent family history. Social History  Substance Use Topics  . Smoking status: Never Smoker  . Smokeless tobacco: Never Used  . Alcohol use Yes   OB History    No data available     Review of Systems  Constitutional: Negative for chills, diaphoresis and fever.  Eyes: Negative for photophobia and visual disturbance.  Musculoskeletal: Positive for arthralgias, joint swelling and myalgias.  Skin: Positive for wound.  Neurological: Positive for numbness and headaches. Negative for dizziness, tremors, syncope, weakness and light-headedness.  Psychiatric/Behavioral: Negative for confusion.    Allergies  Latex and Vicodin  [hydrocodone-acetaminophen]  Home Medications   Prior to Admission medications   Medication Sig Start Date End Date Taking? Authorizing Provider  cetirizine (ZYRTEC) 10 MG tablet Take 10 mg by mouth daily.   Yes [provider]  DULoxetine (CYMBALTA) 20 MG capsule Take 40 mg by mouth daily.   Yes [provider]  ZONISAMIDE PO Take 400 mg by mouth.   Yes [provider]  cyclobenzaprine (FLEXERIL) 5 MG tablet Take 1 tablet (5 mg total) by mouth at bedtime. 12/11/16   Ok Edwards, PA-C  methylPREDNISolone (MEDROL DOSEPAK) 4 MG TBPK tablet Take according to package instructions 10/09/14   Quintella Reichert, MD  oxyCODONE-acetaminophen (PERCOCET/ROXICET) 5-325 MG per tablet Take 1 tablet by mouth every 6 (six) hours as needed for severe pain. 10/09/14   Quintella Reichert, MD   Meds Ordered and Administered this Visit  Medications - No data to display  BP (!) 135/92 (BP Location: Right Arm)   Pulse 76   Temp 97.9 F (36.6 C) (Oral)   LMP 09/29/2014   SpO2 98%  No data found.   Physical Exam  Constitutional: She is oriented to person, place, and time. She appears well-developed and well-nourished. No distress.  HENT:  Head: Normocephalic and atraumatic.  Eyes: Pupils are equal, round, and reactive to light. Conjunctivae are normal.  Neck: Normal range of motion. Neck supple.  Cardiovascular: Normal rate, regular rhythm and normal heart sounds.  Exam reveals no gallop and no friction rub.   No murmur heard. Pulmonary/Chest:  Effort normal and breath sounds normal. She has no wheezes. She has no rales.  Musculoskeletal:  Bilateral elbows without tenderness on palpation. Full ROM, strength normal and equal bilaterally.  Right wrist without tenderness on palpation. Full ROM, strength normal.  Left wrist and hand with tenderness on palpation of the ulnar aspect. Some swelling noted. No wound, erythema. Full ROM with pain of wrist. Decreased ROM of fingers due to swelling  and pain. Slight decrease in sensation of the 5th digit and ulnar aspect of palm.  Lymphadenopathy:    She has no cervical adenopathy.  Neurological: She is alert and oriented to person, place, and time. No cranial nerve deficit. Coordination normal.  Skin: Skin is warm and dry.  Left knee abrasion with some swelling and surrounding erythema. No foreign body seen.  Psychiatric: She has a normal mood and affect. Her behavior is normal. Judgment normal.    Urgent Care Course     Procedures (including critical care time)  Labs Review Labs Reviewed - No data to display  Imaging Review Dg Hand Complete Left  Result Date: 12/11/2016 CLINICAL DATA:  Patient fell on brick steps last evening.  Pain. EXAM: LEFT HAND - COMPLETE 3+ VIEW COMPARISON:  None. FINDINGS: There is no evidence of fracture or dislocation. There is no evidence of arthropathy or other focal bone abnormality. There is soft tissue swelling over the dorsum of the hand. IMPRESSION: No acute osseous abnormality. Soft tissue swelling of the dorsum of the hand. Electronically Signed   By: Ashley Royalty M.D.   On: 12/11/2016 19:35      MDM   1. Left hand pain   2. Fall, initial encounter    Discussed imaging result with patient, no evidence of dislocation or fracture. Patient with Meloxicam at home, take as directed for 10 days for pain and inflammation. Flexeril given for muscle spasms as needed. Patient to ice compress and elevate hand. Wrist brace as needed for activity. Given patient hit her cheeks from the fall, to monitor for worsening of symptoms, N/V, photophobia/phonophobia, to follow up for re-evaluation.    Ok Edwards, PA-C 12/11/16 2232

## 2019-02-18 IMAGING — DX DG HAND COMPLETE 3+V*L*
3 series · 3 of 3 positions shown · non-contrast
Comparison: None.

CLINICAL DATA: Patient fell on brick steps last evening.  Pain.

EXAM:
LEFT HAND - COMPLETE 3+ VIEW

[hand pa]
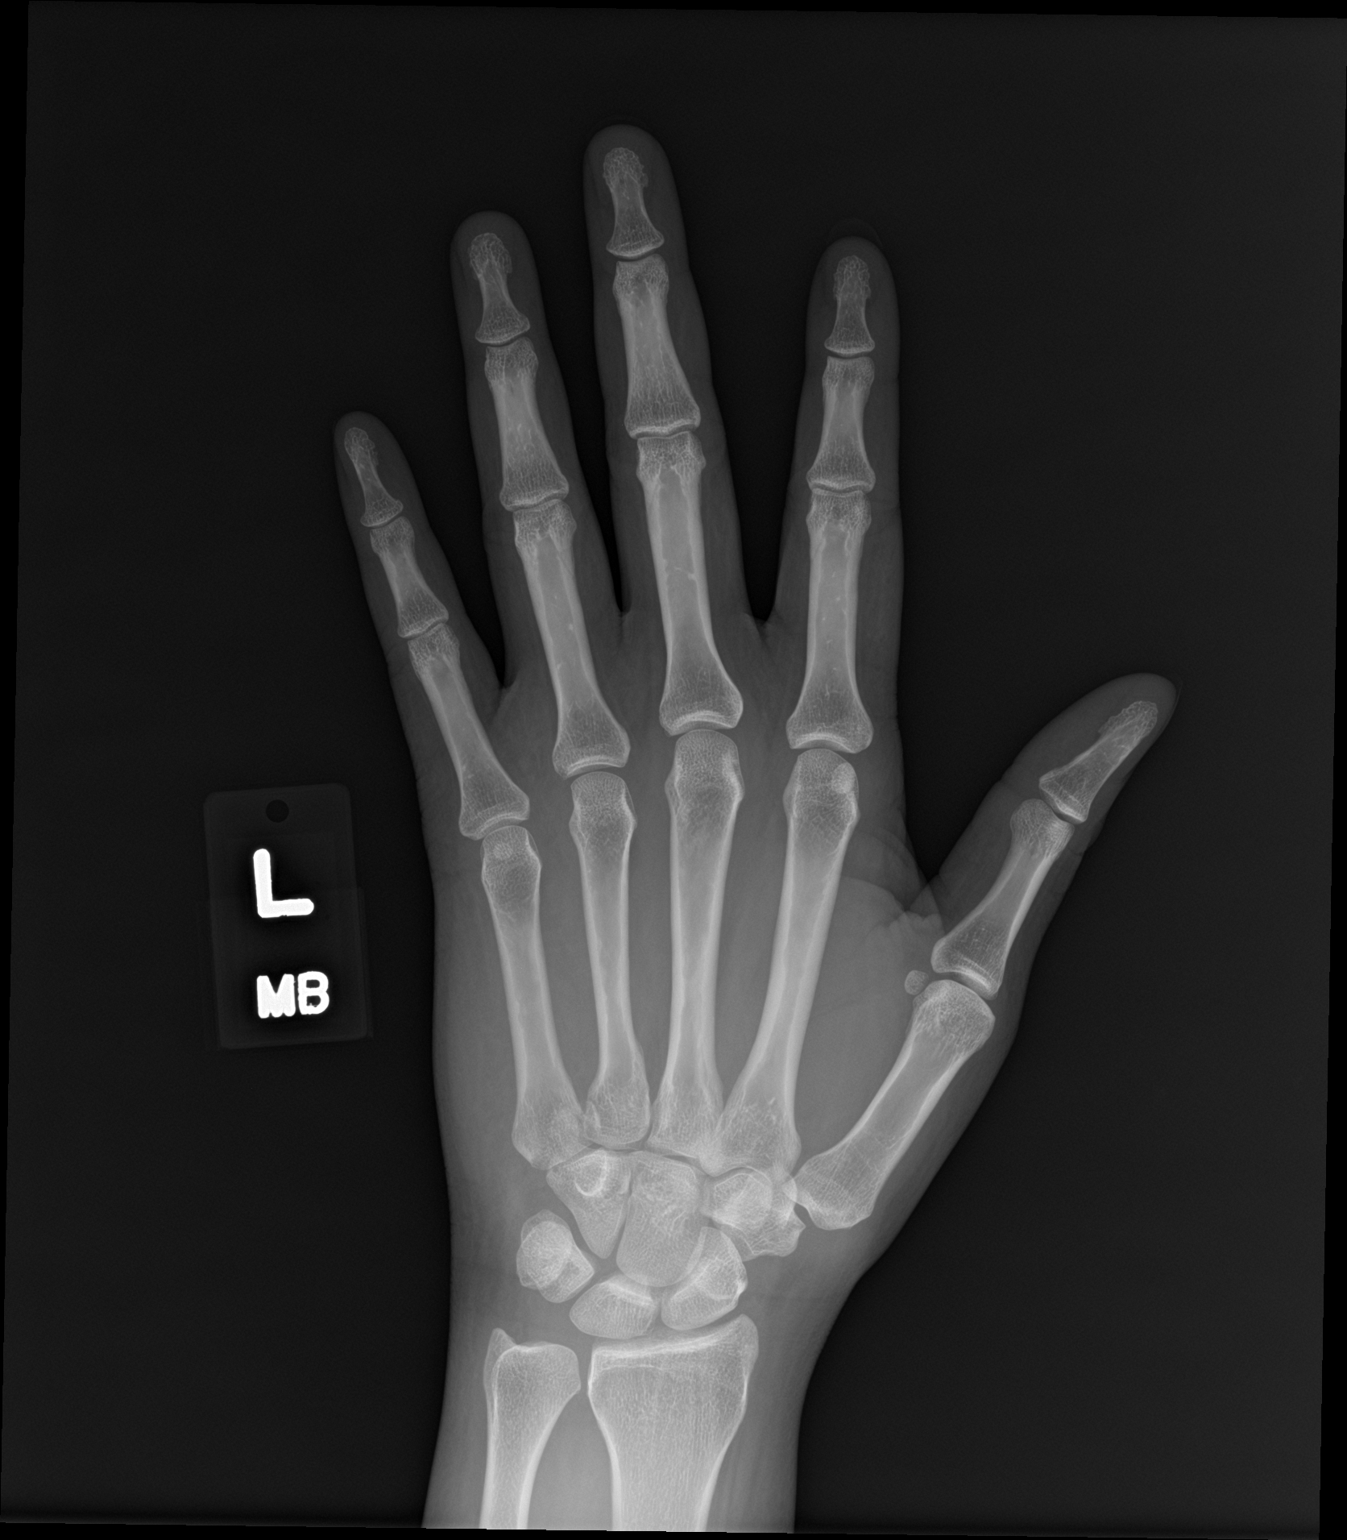

[hand obl]
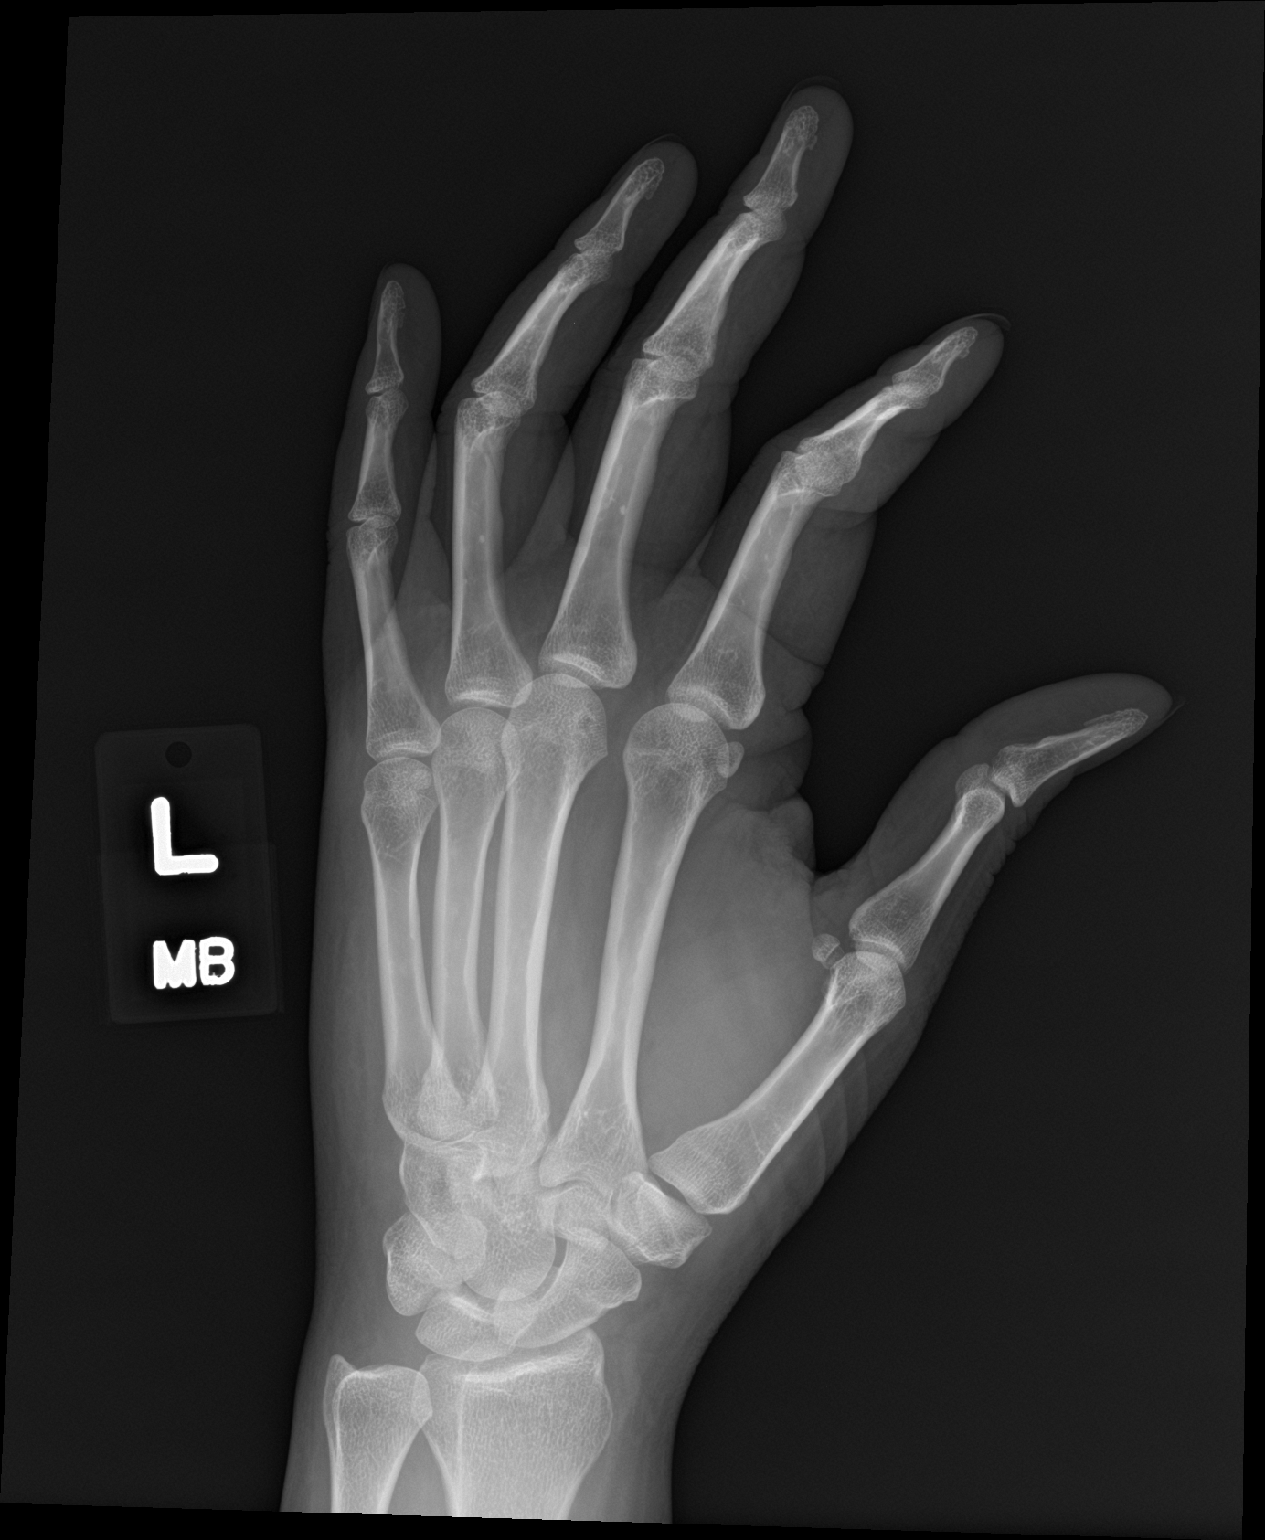

[hand lat]
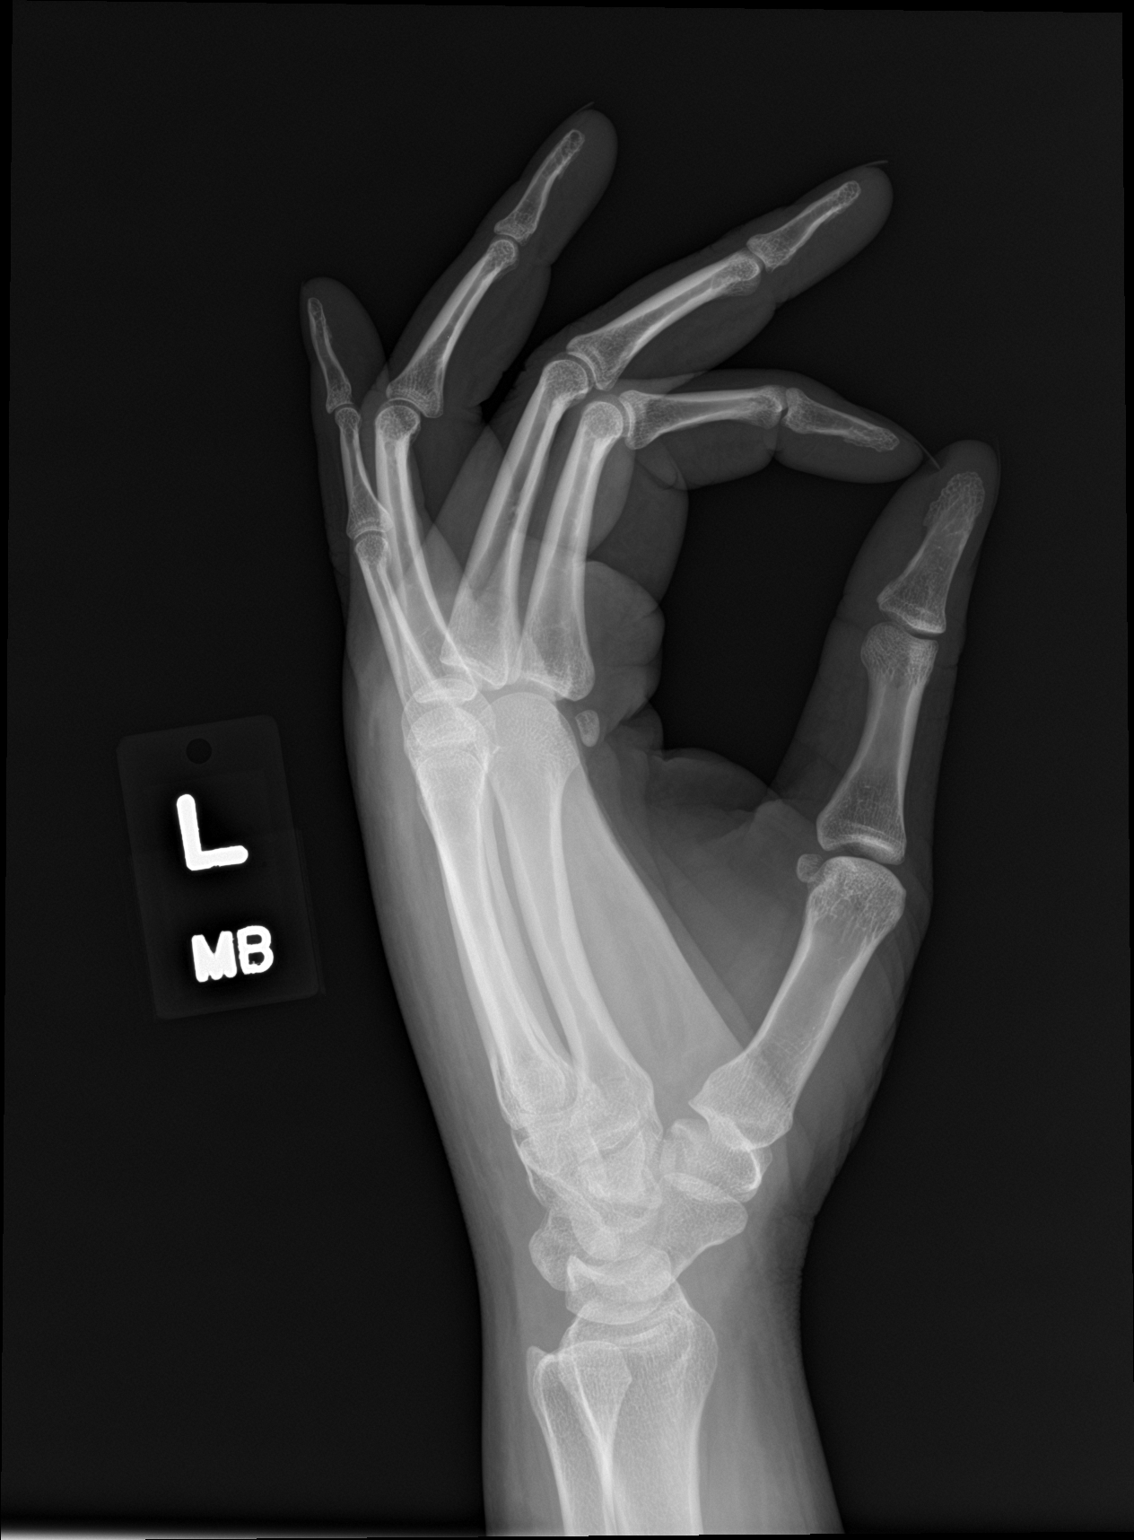

[3 of 3 positions shown; findings below may reference images not displayed]

FINDINGS: There is no evidence of fracture or dislocation. There is no
evidence of arthropathy or other focal bone abnormality. There is
soft tissue swelling over the dorsum of the hand.
IMPRESSION: No acute osseous abnormality. Soft tissue swelling of the dorsum of
the hand.

## 2021-06-28 ENCOUNTER — Other Ambulatory Visit: Payer: Self-pay

## 2021-06-28 ENCOUNTER — Encounter: Payer: Self-pay | Admitting: Family Medicine

## 2021-06-28 ENCOUNTER — Ambulatory Visit (INDEPENDENT_AMBULATORY_CARE_PROVIDER_SITE_OTHER): Payer: Self-pay | Admitting: Family Medicine

## 2021-06-28 ENCOUNTER — Ambulatory Visit: Payer: Self-pay

## 2021-06-28 VITALS — BP 120/78 | HR 79 | Ht 66.0 in | Wt 175.0 lb

## 2021-06-28 DIAGNOSIS — M1710 Unilateral primary osteoarthritis, unspecified knee: Secondary | ICD-10-CM

## 2021-06-28 DIAGNOSIS — M25371 Other instability, right ankle: Secondary | ICD-10-CM

## 2021-06-28 DIAGNOSIS — M25373 Other instability, unspecified ankle: Secondary | ICD-10-CM | POA: Insufficient documentation

## 2021-06-28 DIAGNOSIS — M357 Hypermobility syndrome: Secondary | ICD-10-CM

## 2021-06-28 DIAGNOSIS — M7660 Achilles tendinitis, unspecified leg: Secondary | ICD-10-CM

## 2021-06-28 NOTE — Assessment & Plan Note (Signed)
Noted bilaterally on ultrasound today.  Does have narrowing of the patellofemoral area.  Does have lateral tracking once again looks likely secondary to more of the underlying hypermobility as well and could have been exacerbated with all the physical activity while patient was in the [employer].  Could consider the possibility of injections including steroid, viscosupplementation and PRP.  Patient will look into the [place] for these treatments as well as discussed the possibility of bracing with a Tru pull lite.  Follow-up with me again in 6 weeks

## 2021-06-28 NOTE — Patient Instructions (Addendum)
[place] total Support orthotics  [place] Recovery sandals in house Do prescribed exercises at least 3x a week Vitamin D3 2000IU daily  Turmeric 500mg  daily  Tart cherry extract 1200mg  at night   Tru pull light knee brace ([facility] about it) See you again in 6-8 weeks

## 2021-06-28 NOTE — Assessment & Plan Note (Signed)
Patient does have significant hypermobility that I think is contributing to some of this.  Some of it may have been exacerbated with her time in the [employer].  Patient has had patellofemoral syndrome and does likely have some arthritic changes.  Being treated at the [facility] for this.  Could potentially consider further work-up for such things as Marfan's but I think it is highly unlikely with patient's age at this time.

## 2021-06-28 NOTE — Assessment & Plan Note (Signed)
Is secondary to more of the hypermobility and the posterior tibialis insufficiency.  We discussed the over-the-counter orthotics, heel lifts, proper shoes, recovery sandals.  Patient work with Product/process development scientist to learn home exercises.  Discussed with patient that this could make the knee pain worse or secondary to the knee pain that could exacerbate some of the right ankle pain.  Patient will try the conservative therapy and worsening pain can consider the possibility of injections.  Do feel that the custom orthotics will be helpful to slow down any progression.

## 2021-06-28 NOTE — Progress Notes (Signed)
[place] [place] [address] [place] Phone: [phone] Subjective:   IVilma Meckel, am serving as a scribe for Dr. Hulan Saas. This visit occurred during the SARS-CoV-2 public health emergency.  Safety protocols were in place, including screening questions prior to the visit, additional usage of staff PPE, and extensive cleaning of exam room while observing appropriate contact time as indicated for disinfecting solutions.   I'm seeing this patient by the request  of:  Wenda Low, MD  CC: Ankle pain and knee pain  UTM:[id number]  Thirza Pellicano Jordan-Ferris is a 46 y.o. female coming in with complaint of achilles tendonitis. Was previously seen by [facility] in 2018/19 when pain first occurred. Ankles over compensating for knees about tendonitis.  Patient is concerned.  Patient has had the knee pain for quite some time.  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  Patient has been able to have this covered but feels that the ankles are easier potentially worsening over the course of time.  Patient states that she has been fitted for some custom orthotics but has not gotten them yet.  Past medical history is significant for back surgery as well.       Past Medical History:  Diagnosis Date   Seizures ([place])    FOCAL SEIZURES  NO IN 2 YEARS   Past Surgical History:  Procedure Laterality Date   BACK SURGERY     L4 AND L5 2014   CERVICAL SPINE SURGERY     C 4 AND C5   DILATION AND CURETTAGE OF UTERUS     DILITATION & CURRETTAGE/HYSTROSCOPY WITH HYDROTHERMAL ABLATION N/A 01/01/2014   Procedure: DILATATION & CURETTAGE/HYSTEROSCOPY WITH attempted HYDROTHERMAL ABLATION;  Surgeon: Frederico Hamman, MD;  Location: [facility];  Service: Gynecology;  Laterality: N/A;   Social History   Socioeconomic History   Marital status: Married    Spouse name: Not on file   Number of children: Not on file   Years  of education: Not on file   Highest education level: Not on file  Occupational History   Not on file  Tobacco Use   Smoking status: Never   Smokeless tobacco: Never  Substance and Sexual Activity   Alcohol use: Yes   Drug use: No   Sexual activity: Not on file  Other Topics Concern   Not on file  Social History Narrative   Not on file   Social Determinants of Health   Financial Resource Strain: Not on file  Food Insecurity: Not on file  Transportation Needs: Not on file  Physical Activity: Not on file  Stress: Not on file  Social Connections: Not on file   Allergies  Allergen Reactions   Latex Hives and Itching   Vicodin [Hydrocodone-Acetaminophen] Itching and Rash   No family history on file.  Current Outpatient Medications (Endocrine & Metabolic):    methylPREDNISolone (MEDROL DOSEPAK) 4 MG TBPK tablet, Take according to package instructions   Current Outpatient Medications (Respiratory):    cetirizine (ZYRTEC) 10 MG tablet, Take 10 mg by mouth daily.  Current Outpatient Medications (Analgesics):    oxyCODONE-acetaminophen (PERCOCET/ROXICET) 5-325 MG per tablet, Take 1 tablet by mouth every 6 (six) hours as needed for severe pain.   Current Outpatient Medications (Other):    cyclobenzaprine (FLEXERIL) 5 MG tablet, Take 1 tablet (5 mg total) by mouth at bedtime.   DULoxetine (CYMBALTA) 20 MG capsule, Take 40 mg  by mouth daily.   ZONISAMIDE PO, Take 400 mg by mouth.   Reviewed prior external information including notes and imaging from  primary care provider As well as notes that were available from care everywhere and other healthcare systems.  Was able to see the different diagnoses that have been made over the years.  Past medical history, social, surgical and family history all reviewed in electronic medical record.  No pertanent information unless stated regarding to the chief complaint.   Review of Systems:  No headache, visual changes, nausea, vomiting,  diarrhea, constipation, dizziness, abdominal pain, skin rash, fevers, chills, night sweats, weight loss, swollen lymph nodes, , chest pain, shortness of breath, mood changes. POSITIVE muscle aches, joint swelling  Objective  Blood pressure 120/78, pulse 79, height 5\' 6"  (1.676 m), weight 175 lb (79.4 kg), last menstrual period 09/29/2014, SpO2 99 %.   General: No apparent distress alert and oriented x3 mood and affect normal, dressed appropriately.  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  Patient does not supinate when going up on her toes.  Patient does have significant pes planus with overpronation of the hindfoot and midfoot.  Patient does have hypermobility noted on multiple joints with a Beighton score of 8. Knee exam does show some lateral tracking of the patella right greater than left.  No significant effusion but lacks the last 5 degrees of flexion bilaterally  Limited muscular skeletal ultrasound was performed and interpreted by Hulan Saas, M  Limited ultrasound of patient's right ankle does show significant hypoechoic changes of the posterior tibialis tendon consistent with more of a tendinitis.  Some mild calcific changes noted. Regarding patient's knee does have significant narrowing noted of the patellofemoral joint.  Patient does have some cortical irregularities noted but no significant effusion noted.  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  [id number]; 15 additional minutes spent for Therapeutic exercises as stated in above notes.  This included exercises focusing on stretching, strengthening, with significant focus on eccentric aspects.    Long term goals include an improvement in range of motion, strength, endurance as well as avoiding reinjury. Patient's frequency would include in 1-2 times a day, 3-5 times a week for a duration of 6-12 weeks. Ankle strengthening that included:  Basic range of motion exercises to allow proper full motion at ankle Stretching of the lower leg and hamstrings  Theraband exercises for the lower leg - inversion, eversion, dorsiflexion and plantarflexion each to be completed with a theraband Balance exercises to increase proprioception Weight bearing exercises to increase strength and balance  Proper technique shown and discussed handout in great detail with ATC.  All questions were discussed and answered.     Impression and Recommendations:    The above documentation has been reviewed and is accurate and complete Lyndal Pulley, DO

## 2021-06-29 ENCOUNTER — Ambulatory Visit: Payer: Self-pay | Admitting: Family Medicine

## 2021-09-02 ENCOUNTER — Ambulatory Visit: Payer: No Typology Code available for payment source | Attending: Neurosurgery | Admitting: Physical Therapy

## 2021-09-02 ENCOUNTER — Encounter: Payer: Self-pay | Admitting: Physical Therapy

## 2021-09-02 DIAGNOSIS — R293 Abnormal posture: Secondary | ICD-10-CM | POA: Insufficient documentation

## 2021-09-02 DIAGNOSIS — M5459 Other low back pain: Secondary | ICD-10-CM | POA: Diagnosis present

## 2021-09-02 DIAGNOSIS — M6281 Muscle weakness (generalized): Secondary | ICD-10-CM | POA: Diagnosis present

## 2021-09-02 DIAGNOSIS — R29898 Other symptoms and signs involving the musculoskeletal system: Secondary | ICD-10-CM | POA: Diagnosis present

## 2021-09-02 NOTE — Therapy (Signed)
?[facility] [address] ?[place], [place], [address] ?Phone: [phone]   Fax:  [phone] ? ?Physical Therapy Evaluation ? ?Patient Details  ?Name: Abigail Reynolds ?MRN: [id number] ?Date of Birth: 1975-12-25 ?Referring Provider (PT): Dr. Kathyrn Sheriff ? ? ?Encounter Date: 09/02/2021 ? ? PT End of Session - 09/02/21 1449   ? ? Visit Number 1   ? Number of Visits 15   ? Date for PT Re-Evaluation 10/21/21   ? [facility]ization Type [facility] (approved 1 eval, 14 visits)   ? Authorization Time Period 09/02/21 to 10/21/21   ? Authorization - Visit Number 1   ? Authorization - Number of Visits 14   ? PT Start Time 1402   ? PT Stop Time 6962   ? PT Time Calculation (min) 41 min   ? Activity Tolerance Patient tolerated treatment well   ? Behavior During Therapy [facility] for tasks assessed/performed   ? ?  ?  ? ?  ? ? ?Past Medical History:  ?Diagnosis Date  ? Seizures ([place])   ? 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 ? ?Past Surgical History:  ?Procedure Laterality Date  ? BACK SURGERY    ? L4 AND L5 2014  ? CERVICAL SPINE SURGERY    ? C 4 AND C5  ? DILATION AND CURETTAGE OF UTERUS    ? DILITATION & CURRETTAGE/HYSTROSCOPY WITH HYDROTHERMAL ABLATION N/A 01/01/2014  ? Procedure: DILATATION & CURETTAGE/HYSTEROSCOPY WITH attempted HYDROTHERMAL ABLATION;  Surgeon: Frederico Hamman, MD;  Location: [facility];  Service: Gynecology;  Laterality: N/A;  ? ? ?There were no vitals filed for this visit. ? ? ? Subjective Assessment - 09/02/21 1404   ? ? Subjective My low back and neck are bothering me, I'm not sure if the referring doctor sent info on both or not. They are going to do an injection in my low back, just waiting on approval from the [place]. I've had low back issues for years now. I had PT for my back when I was pregant with my son (who is now 83). I still do a lot of the pelvic rocks they taught me then as it gives me relief. Sometimes I can just be standing in place and my whole left leg was numb,  or I can be asleep and wake up with my leg numb. When I'm standing "readjusting" at the pelvis helps a lot. No incontinence, sometimes I have a combination of gnawing pain and aches/pains, sometimes I can't sleep due to pain as I can't position well; no unexplained weight loss. By the end of the night I am in a lot of pain.   ? Patient Stated Goals figure out how to get life back, pain/numbness really gets in the way of functional tasks, get back to hiking   ? Currently in Pain? Yes   ? Pain Score 5    9/10 at worst  ? Pain Location Back   ? Pain Orientation Right;Left   ? Pain Descriptors / Indicators Radiating;Numbness;Shooting;Stabbing;Penetrating   ? Pain Type Chronic pain   ? Pain Radiating Towards all the way down L Leg, entire leg is involved   ? Pain Onset More than a month ago   ? Pain Frequency Constant   ? Aggravating Factors  anything can make it worse- one time hit her getting up from sitting on the sofa, driving makes leg numb, standing can also make it worse   ? Pain Relieving Factors laying on  heat, muscle relaxer but nothing makes it stop   ? Effect of Pain on Daily Activities can be severe   ? ?  ?  ? ?  ? ? ? ? ? [facility] PT Assessment - 09/02/21 0001   ? ?  ? Assessment  ? Medical Diagnosis low back pain   ? Referring Provider (PT) Dr. Kathyrn Sheriff   ? Onset Date/Surgical Date --   chronic  ? Next MD Visit unsure   ? Prior Therapy [place] for [place]   ?  ? Precautions  ? Precautions None   ?  ? Restrictions  ? Weight Bearing Restrictions No   ?  ? Balance Screen  ? Has the patient fallen in the past 6 months No   ? Has the patient had a decrease in activity level because of a fear of falling?  No   ? Is the patient reluctant to leave their home because of a fear of falling?  No   ?  ? Home Environment  ? Living Environment Private residence   ?  ? Prior Function  ? Level of Independence Independent;Independent with basic ADLs;Independent with gait;Independent with transfers   ? Vocation  Unemployed   ? Leisure "mom" mode pretty much all the time   ?  ? Observation/Other Assessments  ? Observations clonus (-), LLD (-)   ? Focus on Therapeutic Outcomes (FOTO)  42.7   ?  ? ROM / Strength  ? AROM / PROM / Strength AROM;Strength   ?  ? AROM  ? AROM Assessment Site Lumbar   ? Lumbar Flexion WNL but painful; pulling/stretching discomfort   ? Lumbar Extension severe limitation   ? Lumbar - Right Side Bend moderate limitation   ? Lumbar - Left Side Bend moderate limitation   ?  ? Strength  ? Strength Assessment Site Hip;Knee;Ankle   ? Right/Left Hip Right;Left   ? Right Hip Flexion 5/5   ? Right Hip Extension 3/5   ? Right Hip ABduction 4+/5   ? Left Hip Flexion 4/5   ? Left Hip Extension 3/5   ? Left Hip ABduction 3-/5   ? Right/Left Knee Right;Left   ? Right Knee Flexion 5/5   ? Right Knee Extension 5/5   ? Left Knee Flexion 4+/5   ? Left Knee Extension 4+/5   ? Right/Left Ankle Right;Left   ? Right Ankle Dorsiflexion 5/5   ? Left Ankle Dorsiflexion 4/5   ?  ? Flexibility  ? Soft Tissue Assessment /Muscle Length yes   ? Hamstrings mild limitation R LE   ? Quadriceps moderate limitation B   ? Piriformis mild limitation R LE   ?  ? Palpation  ? Palpation comment some spasms and knots noted L glutes/piriformis   ? ?  ?  ? ?  ? ? ? ? ? ? ? ? ? ? ? ? ? ?Objective measurements completed on examination: See above findings.  ? ? ? ? ? [facility] Adult PT Treatment/Exercise - 09/02/21 0001   ? ?  ? Exercises  ? Exercises Lumbar   ?  ? Lumbar Exercises: Supine  ? Bridge 5 reps   ? Other Supine Lumbar Exercises TA set 1x10 3 second holds   ? ?  ?  ? ?  ? ? ?PPT x5 with 3 second holds ? ? ? ? ? ? ? PT Education - 09/02/21 1449   ? ? Education Details exam findings, POC, HEP   ?  Person(s) Educated Patient   ? Methods Explanation;Demonstration;Handout   ? Comprehension Verbalized understanding;Returned demonstration   ? ?  ?  ? ?  ? ? ? PT Short Term Goals - 09/02/21 1454   ? ?  ? PT SHORT TERM GOAL #1  ? Title Will be  compliant with appropriate HEP to be updated PRN   ? Time 3   ? Period Weeks   ? Status New   ? Target Date 09/23/21   ?  ? PT SHORT TERM GOAL #2  ? Title Pain to be no more than 5/10 at worst   ? Time 3   ? Period Weeks   ? Status New   ?  ? PT SHORT TERM GOAL #3  ? Title Will have better understanding of functional and postural mechanics   ? Time 3   ? Period Weeks   ? Status New   ?  ? PT SHORT TERM GOAL #4  ? Title Will be able to perform bed mobility with good mechanics on a consistent basis   ? Time 3   ? Period Weeks   ? Status New   ? ?  ?  ? ?  ? ? ? ? PT Long Term Goals - 09/02/21 1456   ? ?  ? PT LONG TERM GOAL #1  ? Title MMT to improve by 1 grade in all weak groups   ? Time 7   ? Period Weeks   ? Status New   ? Target Date 10/21/21   ?  ? PT LONG TERM GOAL #2  ? Title Will demonstrate correct functional lifting mechanical mechanics from ground to waist height   ? Time 7   ? Period Weeks   ? Status New   ?  ? PT LONG TERM GOAL #3  ? Title Will be able to perform functional tasks such as cooking, laundry, etc with back pain no more than 3/10   ? Time 7   ? Period Weeks   ? Status New   ?  ? PT LONG TERM GOAL #4  ? Title Will demonstrate improved core strength by demonsrating ability to maintain plank for 30 seconds   ? Time 7   ? Period Weeks   ? Status New   ?  ? PT LONG TERM GOAL #5  ? Title Will be independent with appropriate gym based exercise program   ? Time 7   ? Period Weeks   ? Status New   ? ?  ?  ? ?  ? ? ? ? ? ? ? ? ? Plan - 09/02/21 1451   ? ? Clinical Impression Statement Lynasia arrives with back pain that has been going on for quite some time; of note, she does have a diagnosis of hypermobility syndrome made earlier this year. Exam reveals significant functional muscle weakness in BLEs and core, also mild flexibility limitations and limited lumbar ROM. Will benefit from skilled PT services to attempt to address pain and optimize level of function.   ? Personal Factors and Comorbidities  Behavior Pattern;Past/Current Experience;Time since onset of injury/illness/exacerbation   ? Examination-Activity Limitations Locomotion Level;Transfers;Sit;Squat;Stairs;Stand;Lift   ? Examination-Participation Res

## 2021-09-09 ENCOUNTER — Ambulatory Visit: Payer: No Typology Code available for payment source | Admitting: Physical Therapy

## 2021-09-09 ENCOUNTER — Encounter: Payer: Self-pay | Admitting: Physical Therapy

## 2021-09-09 DIAGNOSIS — M6281 Muscle weakness (generalized): Secondary | ICD-10-CM

## 2021-09-09 DIAGNOSIS — M5459 Other low back pain: Secondary | ICD-10-CM

## 2021-09-09 DIAGNOSIS — R293 Abnormal posture: Secondary | ICD-10-CM

## 2021-09-09 NOTE — Therapy (Signed)
[place] ?[place] [address] ?[place], [place], [address] ?Phone: [phone]   Fax:  [phone] ? ?Physical Therapy Treatment ? ?Patient Details  ?Name: Abigail Reynolds ?MRN: [id number] ?Date of Birth: September 13, 1975 ?Referring Provider (PT): Dr. Kathyrn Sheriff ? ? ?Encounter Date: 09/09/2021 ? ? PT End of Session - 09/09/21 1511   ? ? Visit Number 2   ? Date for PT Re-Evaluation 10/21/21   ? PT Start Time 6384   ? PT Stop Time 1515   ? PT Time Calculation (min) 44 min   ? Activity Tolerance Patient tolerated treatment well;Patient limited by pain   ? Behavior During Therapy [facility] for tasks assessed/performed   ? ?  ?  ? ?  ? ? ?Past Medical History:  ?Diagnosis Date  ? Seizures ([place])   ? 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 ? ?Past Surgical History:  ?Procedure Laterality Date  ? BACK SURGERY    ? L4 AND L5 2014  ? CERVICAL SPINE SURGERY    ? C 4 AND C5  ? DILATION AND CURETTAGE OF UTERUS    ? DILITATION & CURRETTAGE/HYSTROSCOPY WITH HYDROTHERMAL ABLATION N/A 01/01/2014  ? Procedure: DILATATION & CURETTAGE/HYSTEROSCOPY WITH attempted HYDROTHERMAL ABLATION;  Surgeon: Frederico Hamman, MD;  Location: [facility];  Service: Gynecology;  Laterality: N/A;  ? ? ?There were no vitals filed for this visit. ? ? Subjective Assessment - 09/09/21 1433   ? ? Subjective Pt reported some lower back pain. Low back started pausing thins week. Pain is worst with prolong standing or sitting.   ? Currently in Pain? Yes   ? Pain Score 7    ? Pain Location Back   ? Pain Orientation Lower   ? ?  ?  ? ?  ? ? ? ? ? ? ? ? ? ? ? ? ? ? ? ? ? ? ? ? [place] Adult PT Treatment/Exercise - 09/09/21 0001   ? ?  ? Lumbar Exercises: Aerobic  ? Nustep L3x 61mn   ?  ? Lumbar Exercises: Machines for Strengthening  ? Cybex Knee Extension 5lb 2x10   ? Cybex Knee Flexion 20lb 2x10   ?  ? Lumbar Exercises: Standing  ? Shoulder Extension Strengthening;Power Tower;Both;10 reps   x2  ? Shoulder Extension Limitations 5   ?  ?  Lumbar Exercises: Seated  ? Sit to Stand 5 reps   x2  ? Other Seated Lumbar Exercises Rows red 2x10   ? ?  ?  ? ?  ? ? ? ? ? ? ? ? ? ? ? ? PT Short Term Goals - 09/02/21 1454   ? ?  ? PT SHORT TERM GOAL #1  ? Title Will be compliant with appropriate HEP to be updated PRN   ? Time 3   ? Period Weeks   ? Status New   ? Target Date 09/23/21   ?  ? PT SHORT TERM GOAL #2  ? Title Pain to be no more than 5/10 at worst   ? Time 3   ? Period Weeks   ? Status New   ?  ? PT SHORT TERM GOAL #3  ? Title Will have better understanding of functional and postural mechanics   ? Time 3   ? Period Weeks   ? Status New   ?  ? PT SHORT TERM GOAL #4  ? Title Will be able to perform bed mobility with good mechanics on  a consistent basis   ? Time 3   ? Period Weeks   ? Status New   ? ?  ?  ? ?  ? ? ? ? PT Long Term Goals - 09/02/21 1456   ? ?  ? PT LONG TERM GOAL #1  ? Title MMT to improve by 1 grade in all weak groups   ? Time 7   ? Period Weeks   ? Status New   ? Target Date 10/21/21   ?  ? PT LONG TERM GOAL #2  ? Title Will demonstrate correct functional lifting mechanical mechanics from ground to waist height   ? Time 7   ? Period Weeks   ? Status New   ?  ? PT LONG TERM GOAL #3  ? Title Will be able to perform functional tasks such as cooking, laundry, etc with back pain no more than 3/10   ? Time 7   ? Period Weeks   ? Status New   ?  ? PT LONG TERM GOAL #4  ? Title Will demonstrate improved core strength by demonsrating ability to maintain plank for 30 seconds   ? Time 7   ? Period Weeks   ? Status New   ?  ? PT LONG TERM GOAL #5  ? Title Will be independent with appropriate gym based exercise program   ? Time 7   ? Period Weeks   ? Status New   ? ?  ?  ? ?  ? ? ? ? ? ? ? ? Plan - 09/09/21 1513   ? ? Clinical Impression Statement Pt enters clinic reporting continues back pain. She is very guarded with mobility. Cues for sequencing and form with sit to stands. Pt did report a puling sensation behind knees with hamstring curls.  Postural cues required with shoulder ext as she started to fatigue.   ? Personal Factors and Comorbidities Behavior Pattern;Past/Current Experience;Time since onset of injury/illness/exacerbation   ? Examination-Activity Limitations Locomotion Level;Transfers;Sit;Squat;Stairs;Stand;Lift   ? Examination-Participation Restrictions Community Activity;Driving;[employer] Work   ? Rehab Potential Good   ? PT Frequency 2x / week   ? PT Treatment/Interventions ADLs/Self Care Home Management;Cryotherapy;Electrical Stimulation;Iontophoresis '4mg'$ /ml Dexamethasone;Moist Heat;Traction;Ultrasound;Functional mobility training;Therapeutic activities;Therapeutic exercise;Balance training;Neuromuscular re-education;Patient/family education;Manual techniques;Passive range of motion;Dry needling;Energy conservation;Taping   ? PT Next Visit Plan focus on core and BLE strength, flexibility   ? PT Home Exercise Plan Rows   ? ?  ?  ? ?  ? ? ?Patient will benefit from skilled therapeutic intervention in order to improve the following deficits and impairments:  Decreased range of motion, Difficulty walking, Increased fascial restricitons, Increased muscle spasms, Decreased activity tolerance, Pain, Impaired perceived functional ability, Hypomobility, Impaired flexibility ? ?Visit Diagnosis: ?Other low back pain ? ?Abnormal posture ? ?Muscle weakness (generalized) ? ? ? ? ?Problem List ?Patient Active Problem List  ? Diagnosis Date Noted  ? Ankle instability 06/28/2021  ? 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 ? ?Scot Jun, PTA ?09/09/2021, 3:16 PM ? ?[place] ?[address] ?[address] ?[place], [place], [address] ?Phone: [phone]   Fax:  [phone] ? ?Name: DIANNAH RINDFLEISCH ?MRN: [id number] ?Date of Birth: [birth year] ? ? ? ?

## 2021-09-15 ENCOUNTER — Ambulatory Visit: Payer: No Typology Code available for payment source | Admitting: Physical Therapy

## 2021-09-17 ENCOUNTER — Ambulatory Visit: Payer: No Typology Code available for payment source | Admitting: Physical Therapy

## 2021-09-20 ENCOUNTER — Encounter: Payer: Self-pay | Admitting: Physical Therapy

## 2021-09-20 ENCOUNTER — Ambulatory Visit: Payer: No Typology Code available for payment source | Admitting: Physical Therapy

## 2021-09-20 DIAGNOSIS — M5459 Other low back pain: Secondary | ICD-10-CM

## 2021-09-20 DIAGNOSIS — R293 Abnormal posture: Secondary | ICD-10-CM

## 2021-09-20 DIAGNOSIS — R29898 Other symptoms and signs involving the musculoskeletal system: Secondary | ICD-10-CM

## 2021-09-20 DIAGNOSIS — M6281 Muscle weakness (generalized): Secondary | ICD-10-CM

## 2021-09-20 NOTE — Therapy (Signed)
[facility] ?[place] ?[address] ?[place], [place], [address] ?Phone: [phone]   Fax:  [phone] ? ?Physical Therapy Treatment ? ?Patient Details  ?Name: Abigail Reynolds ?MRN: [id number] ?Date of Birth: [birth year] ?Referring Provider (PT): Dr. Kathyrn Sheriff ? ? ?Encounter Date: 09/20/2021 ? ? PT End of Session - 09/20/21 1442   ? ? Visit Number 3   ? Number of Visits 15   ? Date for PT Re-Evaluation 10/21/21   ? Authorization Type VA (approved 1 eval, 14 visits)   ? Authorization Time Period 09/02/21 to 10/21/21   ? Authorization - Visit Number 3   ? Authorization - Number of Visits 14   ? PT Start Time 7341   ? PT Stop Time 9379   ? PT Time Calculation (min) 39 min   ? Activity Tolerance Patient tolerated treatment well;Patient limited by pain   ? Behavior During Therapy [facility] for tasks assessed/performed   ? ?  ?  ? ?  ? ? ?Past Medical History:  ?Diagnosis Date  ? Seizures ([place])   ? 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 ? ?Past Surgical History:  ?Procedure Laterality Date  ? BACK SURGERY    ? L4 AND L5 08/28/2012  ? CERVICAL SPINE SURGERY    ? C 4 AND C5  ? DILATION AND CURETTAGE OF UTERUS    ? DILITATION & CURRETTAGE/HYSTROSCOPY WITH HYDROTHERMAL ABLATION N/A 01/01/2014  ? Procedure: DILATATION & CURETTAGE/HYSTEROSCOPY WITH attempted HYDROTHERMAL ABLATION;  Surgeon: Frederico Hamman, MD;  Location: [facility];  Service: Gynecology;  Laterality: N/A;  ? ? ?There were no vitals filed for this visit. ? ? Subjective Assessment - 09/20/21 1407   ? ? Subjective I had the injection on thursday, they told me it was OK to come to PT, shouldn't have any issues. Scapular retractions have caused increased neck pain. Still having lower back spasms, taking it easy.   ? Patient Stated Goals figure out how to get life back, pain/numbness really gets in the way of functional tasks, get back to hiking   ? Currently in Pain? No/denies   ? ?  ?  ? ?  ? ? ? ? ? ? ? ? ? ? ? ? ? ? ? ? ? ? ? ? [place]  Adult PT Treatment/Exercise - 09/20/21 0001   ? ?  ? Lumbar Exercises: Aerobic  ? Nustep L4 x 6 min BLEs only   ?  ? Lumbar Exercises: Standing  ? Other Standing Lumbar Exercises step ups on 4 inch step x10 B   ?  ? Lumbar Exercises: Supine  ? Bridge 10 reps   ? Other Supine Lumbar Exercises clamshells with red TB 1x10 2 second holds; bridge with clam shell 1x10   ? Other Supine Lumbar Exercises PPT 1x10 3 second holds; PPT with 2022-08-29 1x10 B; dead bugs 1x10 B with PPT   ? ?  ?  ? ?  ? ? ? ? ? ? ? ? ? ? PT Education - 09/20/21 1442   ? ? Education Details exercise form, chin tuck with scapular retraction to elimiinate shoulder pain   ? Person(s) Educated Patient   ? Methods Explanation   ? Comprehension Verbalized understanding   ? ?  ?  ? ?  ? ? ? PT Short Term Goals - 09/02/21 1454   ? ?  ? PT SHORT TERM GOAL #1  ? Title Will be compliant with appropriate HEP to  be updated PRN   ? Time 3   ? Period Weeks   ? Status New   ? Target Date 09/23/21   ?  ? PT SHORT TERM GOAL #2  ? Title Pain to be no more than 5/10 at worst   ? Time 3   ? Period Weeks   ? Status New   ?  ? PT SHORT TERM GOAL #3  ? Title Will have better understanding of functional and postural mechanics   ? Time 3   ? Period Weeks   ? Status New   ?  ? PT SHORT TERM GOAL #4  ? Title Will be able to perform bed mobility with good mechanics on a consistent basis   ? Time 3   ? Period Weeks   ? Status New   ? ?  ?  ? ?  ? ? ? ? PT Long Term Goals - 09/02/21 1456   ? ?  ? PT LONG TERM GOAL #1  ? Title MMT to improve by 1 grade in all weak groups   ? Time 7   ? Period Weeks   ? Status New   ? Target Date 10/21/21   ?  ? PT LONG TERM GOAL #2  ? Title Will demonstrate correct functional lifting mechanical mechanics from ground to waist height   ? Time 7   ? Period Weeks   ? Status New   ?  ? PT LONG TERM GOAL #3  ? Title Will be able to perform functional tasks such as cooking, laundry, etc with back pain no more than 3/10   ? Time 7   ? Period Weeks   ? Status  New   ?  ? PT LONG TERM GOAL #4  ? Title Will demonstrate improved core strength by demonsrating ability to maintain plank for 30 seconds   ? Time 7   ? Period Weeks   ? Status New   ?  ? PT LONG TERM GOAL #5  ? Title Will be independent with appropriate gym based exercise program   ? Time 7   ? Period Weeks   ? Status New   ? ?  ?  ? ?  ? ? ? ? ? ? ? ? Plan - 09/20/21 1443   ? ? Clinical Impression Statement [name] arrives doing OK, had the injection in her back last Thursday, tells me that office cleared her to come do PT this week. Focused mostly on core and BLE strength today, also took a look at her form for scapular retractions as these should not be causing pain. Will continue to progress as tolerated.   ? Personal Factors and Comorbidities Behavior Pattern;Past/Current Experience;Time since onset of injury/illness/exacerbation   ? Examination-Activity Limitations Locomotion Level;Transfers;Sit;Squat;Stairs;Stand;Lift   ? Examination-Participation Restrictions Community Activity;Driving;[employer] Work   ? Stability/Clinical Decision Making Stable/Uncomplicated   ? Clinical Decision Making Low   ? Rehab Potential Good   ? PT Frequency 2x / week   ? PT Duration Other (comment)   ? PT Treatment/Interventions ADLs/Self Care Home Management;Cryotherapy;Electrical Stimulation;Iontophoresis '4mg'$ /ml Dexamethasone;Moist Heat;Traction;Ultrasound;Functional mobility training;Therapeutic activities;Therapeutic exercise;Balance training;Neuromuscular re-education;Patient/family education;Manual techniques;Passive range of motion;Dry needling;Energy conservation;Taping   ? PT Next Visit Plan focus on core and BLE strength, flexibility   ? PT Home Exercise Plan Rows   ? Consulted and Agree with Plan of Care Patient   ? ?  ?  ? ?  ? ? ?Patient will benefit from skilled therapeutic intervention in  order to improve the following deficits and impairments:  Decreased range of motion, Difficulty walking, Increased fascial  restricitons, Increased muscle spasms, Decreased activity tolerance, Pain, Impaired perceived functional ability, Hypomobility, Impaired flexibility ? ?Visit Diagnosis: ?Other low back pain ? ?Muscle weakness (generalized) ? ?Other symptoms and signs involving the musculoskeletal system ? ?Abnormal posture ? ? ? ? ?Problem List ?Patient Active Problem List  ? Diagnosis Date Noted  ? Ankle instability 06/28/2021  ? 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 ?Advait Buice U PT, DPT, PN2  ? ?Supplemental Physical Therapist ?[place]  ? ? ? ? ? ?[place] ?[address] ?[place], [place], [address] ?Phone: [phone]   Fax:  [phone] ? ?Name: SEARRA CARNATHAN ?MRN: [id number] ?Date of Birth: September 19, 1975 ? ? ? ?

## 2021-09-23 ENCOUNTER — Ambulatory Visit: Payer: No Typology Code available for payment source | Admitting: Physical Therapy

## 2021-09-23 ENCOUNTER — Encounter: Payer: Self-pay | Admitting: Physical Therapy

## 2021-09-23 DIAGNOSIS — R293 Abnormal posture: Secondary | ICD-10-CM

## 2021-09-23 DIAGNOSIS — M5459 Other low back pain: Secondary | ICD-10-CM | POA: Diagnosis not present

## 2021-09-23 DIAGNOSIS — M6281 Muscle weakness (generalized): Secondary | ICD-10-CM

## 2021-09-23 DIAGNOSIS — R29898 Other symptoms and signs involving the musculoskeletal system: Secondary | ICD-10-CM

## 2021-09-23 NOTE — Therapy (Signed)
[place] ?[place] ?[address] ?[place], [place], [address] ?Phone: [phone]   Fax:  [phone] ? ?Physical Therapy Treatment ? ?Patient Details  ?Name: Abigail Reynolds ?MRN: [id number] ?Date of Birth: Jan 15, 1976 ?Referring Provider (PT): Dr. Kathyrn Reynolds ? ? ?Encounter Date: 09/23/2021 ? ? PT End of Session - 09/23/21 1628   ? ? Visit Number 4   ? Number of Visits 15   ? Date for PT Re-Evaluation 10/21/21   ? Authorization Type VA (approved 1 eval, 14 visits)   ? Authorization Time Period 09/02/21 to 10/21/21   ? Authorization - Visit Number 4   ? Authorization - Number of Visits 14   ? PT Start Time 5361   arrived a few minutes late  ? PT Stop Time 4431   ? PT Time Calculation (min) 37 min   ? Activity Tolerance Patient tolerated treatment well;Patient limited by pain   ? Behavior During Therapy [facility] for tasks assessed/performed   ? ?  ?  ? ?  ? ? ?Past Medical History:  ?Diagnosis Date  ? Seizures ([place])   ? 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 ? ?Past Surgical History:  ?Procedure Laterality Date  ? BACK SURGERY    ? L4 AND L5 2014  ? CERVICAL SPINE SURGERY    ? C 4 AND C5  ? DILATION AND CURETTAGE OF UTERUS    ? DILITATION & CURRETTAGE/HYSTROSCOPY WITH HYDROTHERMAL ABLATION N/A 01/01/2014  ? Procedure: DILATATION & CURETTAGE/HYSTEROSCOPY WITH attempted HYDROTHERMAL ABLATION;  Surgeon: Abigail Hamman, MD;  Location: [facility];  Service: Gynecology;  Laterality: N/A;  ? ? ?There were no vitals filed for this visit. ? ? Subjective Assessment - 09/23/21 1545   ? ? Subjective My back has felt like its going to buckle the past couple of days; my birthday is on [birth year] 2[birth year] and I'm trying to be careful so I don't end up in bed on a heat pack that day   ? Patient Stated Goals figure out how to get life back, pain/numbness really gets in the way of functional tasks, get back to hiking   ? Currently in Pain? Yes   ? Pain Score 5    ? Pain Location Back   ? Pain Orientation Lower    ? Pain Descriptors / Indicators Cramping   ? ?  ?  ? ?  ? ? ? ? ? ? ? ? ? ? ? ? ? ? ? ? ? ? ? ? [facility] Adult PT Treatment/Exercise - 09/23/21 0001   ? ?  ? Lumbar Exercises: Stretches  ? Other Lumbar Stretch Exercise lumbar rotation stretch 5x5 second holds   ?  ? Lumbar Exercises: Aerobic  ? Nustep L4 x 6 min BLEs only   ?  ? Lumbar Exercises: Supine  ? Bridge 10 reps   ? Other Supine Lumbar Exercises isometric hip extensions 1x10 B 3 second holds; bridge with clamshell and red TB 1x10   ? Other Supine Lumbar Exercises PPT 1x15; dead bug with PPT 1x10 B   ? ?  ?  ? ?  ? ? ? ? ? ? ? ? ? ? PT Education - 09/23/21 1627   ? ? Education Details exercise form, use of moist heat and exercise modification to try to cut down on intensity of post exercise DOMS   ? Person(s) Educated Patient   ? Methods Explanation   ? Comprehension Verbalized understanding   ? ?  ?  ? ?  ? ? ?  PT Short Term Goals - 09/02/21 1454   ? ?  ? PT SHORT TERM GOAL #1  ? Title Will be compliant with appropriate HEP to be updated PRN   ? Time 3   ? Period Weeks   ? Status New   ? Target Date 09/23/21   ?  ? PT SHORT TERM GOAL #2  ? Title Pain to be no more than 5/10 at worst   ? Time 3   ? Period Weeks   ? Status New   ?  ? PT SHORT TERM GOAL #3  ? Title Will have better understanding of functional and postural mechanics   ? Time 3   ? Period Weeks   ? Status New   ?  ? PT SHORT TERM GOAL #4  ? Title Will be able to perform bed mobility with good mechanics on a consistent basis   ? Time 3   ? Period Weeks   ? Status New   ? ?  ?  ? ?  ? ? ? ? PT Long Term Goals - 09/02/21 1456   ? ?  ? PT LONG TERM GOAL #1  ? Title MMT to improve by 1 grade in all weak groups   ? Time 7   ? Period Weeks   ? Status New   ? Target Date 10/21/21   ?  ? PT LONG TERM GOAL #2  ? Title Will demonstrate correct functional lifting mechanical mechanics from ground to waist height   ? Time 7   ? Period Weeks   ? Status New   ?  ? PT LONG TERM GOAL #3  ? Title Will be able to  perform functional tasks such as cooking, laundry, etc with back pain no more than 3/10   ? Time 7   ? Period Weeks   ? Status New   ?  ? PT LONG TERM GOAL #4  ? Title Will demonstrate improved core strength by demonsrating ability to maintain plank for 30 seconds   ? Time 7   ? Period Weeks   ? Status New   ?  ? PT LONG TERM GOAL #5  ? Title Will be independent with appropriate gym based exercise program   ? Time 7   ? Period Weeks   ? Status New   ? ?  ?  ? ?  ? ? ? ? ? ? ? ? Plan - 09/23/21 1628   ? ? Clinical Impression Statement Abigail Reynolds arrives today doing OK, has had sensations of her back wanting to ?buckle? at times as well as a lot of mm soreness/stiffness after last session. We warmed up on the Nustep, then tried doing some of her lumbar/core exercises on a heat back for part of session today. Also modified exercises as much as reasonably able to while still working towards goals as well. Will continue efforts.  She was asking about her neck as well, at first I thought the order was just for her lumbar spine- but double checking, VA auth actually does include cervical spine, I will assess and treat this at her next appt.   ? Personal Factors and Comorbidities Behavior Pattern;Past/Current Experience;Time since onset of injury/illness/exacerbation   ? Examination-Activity Limitations Locomotion Level;Transfers;Sit;Squat;Stairs;Stand;Lift   ? Examination-Participation Restrictions Community Activity;Driving;Abigail Reynolds Work   ? Stability/Clinical Decision Making Stable/Uncomplicated   ? Clinical Decision Making Low   ? Rehab Potential Good   ? PT Frequency 2x / week   ?  PT Duration Other (comment)   ? PT Treatment/Interventions ADLs/Self Care Home Management;Cryotherapy;Electrical Stimulation;Iontophoresis '4mg'$ /ml Dexamethasone;Moist Heat;Traction;Ultrasound;Functional mobility training;Therapeutic activities;Therapeutic exercise;Balance training;Neuromuscular re-education;Patient/family education;Manual  techniques;Passive range of motion;Dry needling;Energy conservation;Taping   ? PT Next Visit Plan neck assessment   ? PT Home Exercise Plan Rows   ? Consulted and Agree with Plan of Care Patient   ? ?  ?  ? ?  ? ? ?Patient will benefit from skilled therapeutic intervention in order to improve the following deficits and impairments:  Decreased range of motion, Difficulty walking, Increased fascial restricitons, Increased muscle spasms, Decreased activity tolerance, Pain, Impaired perceived functional ability, Hypomobility, Impaired flexibility ? ?Visit Diagnosis: ?Other low back pain ? ?Muscle weakness (generalized) ? ?Other symptoms and signs involving the musculoskeletal system ? ?Abnormal posture ? ? ? ? ?Problem List ?Patient Active Problem List  ? Diagnosis Date Noted  ? Ankle instability 06/28/2021  ? 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 ?Keiston Manley U PT, DPT, PN2  ? ?Supplemental Physical Therapist ?[facility]  ? ? ? ? ? ?[place] ?[address] ?[place], [place], [address] ?Phone: [phone]   Fax:  [phone] ? ?Name: Abigail Reynolds ?MRN: [id number] ?Date of Birth: [birth year] ? ? ? ?

## 2021-09-27 ENCOUNTER — Ambulatory Visit: Payer: No Typology Code available for payment source | Admitting: Physical Therapy

## 2021-09-30 ENCOUNTER — Encounter: Payer: Self-pay | Admitting: Physical Therapy

## 2021-09-30 ENCOUNTER — Ambulatory Visit: Payer: No Typology Code available for payment source | Attending: Neurosurgery | Admitting: Physical Therapy

## 2021-09-30 DIAGNOSIS — M6281 Muscle weakness (generalized): Secondary | ICD-10-CM | POA: Insufficient documentation

## 2021-09-30 DIAGNOSIS — R293 Abnormal posture: Secondary | ICD-10-CM | POA: Diagnosis present

## 2021-09-30 DIAGNOSIS — M5459 Other low back pain: Secondary | ICD-10-CM | POA: Diagnosis present

## 2021-09-30 DIAGNOSIS — M542 Cervicalgia: Secondary | ICD-10-CM | POA: Diagnosis present

## 2021-09-30 DIAGNOSIS — R29898 Other symptoms and signs involving the musculoskeletal system: Secondary | ICD-10-CM | POA: Diagnosis present

## 2021-09-30 NOTE — Therapy (Signed)
[facility] ?[place] ?[address] ?[place], [place], [address] ?Phone: [phone]   Fax:  [phone] ? ?Physical Therapy Treatment ? ?Patient Details  ?Name: Abigail Reynolds ?MRN: [id number] ?Date of Birth: [birth year] ?Referring Provider (PT): Dr. Kathyrn Sheriff ? ? ?Encounter Date: 09/30/2021 ? ? PT End of Session - 09/30/21 1500   ? ? Visit Number 5   ? Number of Visits 15   ? Date for PT Re-Evaluation 10/21/21   ? Authorization Type VA (approved 1 eval, 14 visits)   ? Authorization Time Period 09/02/21 to 10/21/21   ? Authorization - Visit Number 5   ? Authorization - Number of Visits 14   ? PT Start Time 1406   ? PT Stop Time 7408   ? PT Time Calculation (min) 39 min   ? Activity Tolerance Patient tolerated treatment well;Patient limited by pain   ? Behavior During Therapy [facility] for tasks assessed/performed   ? ?  ?  ? ?  ? ? ?Past Medical History:  ?Diagnosis Date  ? Seizures ([place])   ? 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 ? ?Past Surgical History:  ?Procedure Laterality Date  ? BACK SURGERY    ? L4 AND L5 2014  ? CERVICAL SPINE SURGERY    ? C 4 AND C5  ? DILATION AND CURETTAGE OF UTERUS    ? DILITATION & CURRETTAGE/HYSTROSCOPY WITH HYDROTHERMAL ABLATION N/A 01/01/2014  ? Procedure: DILATATION & CURETTAGE/HYSTEROSCOPY WITH attempted HYDROTHERMAL ABLATION;  Surgeon: Frederico Hamman, MD;  Location: [facility];  Service: Gynecology;  Laterality: N/A;  ? ? ?There were no vitals filed for this visit. ? ? Subjective Assessment - 09/30/21 1407   ? ? Subjective I still needed to use the heating pad after therapy, better than the time before that but I'm giving into using the heating pad at night. My neck causes some numbness in my right arm around the elbow like I hit my funny bone but constant, the left arm has a shooting pain going all the way down and it feels very weak, I can almost drop things with the left arm. I get really bad HAs from the neck pain. 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 I know I have a bulging disc under this level now too.   ? Patient Stated Goals figure out how to get life back, pain/numbness really gets in the way of functional tasks, get back to hiking, take care of neck   ? Currently in Pain? Yes   ? Pain Score 5    ? Pain Location Neck   ? Pain Orientation Right;Left   ? Pain Descriptors / Indicators Radiating;Tingling;Sharp   ? Pain Type Chronic pain   ? Pain Onset More than a month ago   ? Pain Frequency Constant   ? Aggravating Factors  turning head from side to side makes pain shoot down arms   ? Pain Relieving Factors heat   ? Effect of Pain on Daily Activities can be severe   ? ?  ?  ? ?  ? ? ? ? ? [facility] PT Assessment - 09/30/21 0001   ? ?  ? Assessment  ? Medical Diagnosis low back pain and neck pain   ? Referring Provider (PT) Dr. Kathyrn Sheriff   ? Onset Date/Surgical Date --   chronic  ?  Next MD Visit unsure   ? Prior Therapy PT in [facility] [place] for [place] back   ?  ? Precautions  ? Precautions None   ?  ? Restrictions  ? Weight Bearing Restrictions No   ?  ? Balance Screen  ? Has the patient fallen in the past 6 months No   ? Has the patient had a decrease in activity level because of a fear of falling?  No   ? Is the patient reluctant to leave their home because of a fear of falling?  No   ?  ? Home Environment  ? Living Environment Private residence   ?  ? Prior Function  ? Level of Independence Independent;Independent with basic ADLs;Independent with gait;Independent with transfers   ? Vocation Unemployed   ? Leisure "mom" mode pretty much all the time   ?  ? AROM  ? AROM Assessment Site Cervical;Thoracic   ? Cervical Flexion mild limitation   ? Cervical Extension moderate limitation   ? Cervical - Right Side Bend severe limitation   ? Cervical - Left Side Bend severe limitation   ? Cervical - Right Rotation moderate limitation   ? Cervical - Left  Rotation moderate limitation   ? Thoracic Flexion moderate limitation   ? Thoracic Extension mild limitation   ? Thoracic - Right Side Bend moderate limitation   ? Thoracic - Left Side Bend moderate  limitation   ? Thoracic - Right Rotation mild limitation   ? Thoracic - Left Rotation mild limitation   ?  ? Strength  ? Strength Assessment Site Shoulder;Elbow   ? Right/Left Shoulder Right;Left   ? Right Shoulder Flexion 4/5   ? Right Shoulder Extension 3-/5   ? Right Shoulder ABduction 3+/5   ? Right Shoulder Internal Rotation 4-/5   ? Right Shoulder External Rotation 4-/5   ? Left Shoulder Flexion 4-/5   ? Left Shoulder Extension 2+/5   ? Left Shoulder ABduction 3+/5   ? Left Shoulder Internal Rotation 4-/5   ? Left Shoulder External Rotation 3+/5   ? Right/Left Elbow Right;Left   ?  ? Palpation  ? Palpation comment significant spasms noted B upper traps and paraspinals, suboccipitals   ? ?  ?  ? ?  ? ? ? ? ? ? ? ? ? ? ? ? ? ? ? ? [facility] Adult PT Treatment/Exercise - 09/30/21 0001   ? ?  ? Exercises  ? Exercises Neck   ?  ? Neck Exercises: Stretches  ? Upper Trapezius Stretch Right;Left;1 rep;20 seconds   ? Levator Stretch Right;Left;1 rep;20 seconds   ? Other Neck Stretches scm stretch 1x10B   ? ?  ?  ? ?  ? ? ? ? ? ? ? ? ? ? PT Education - 09/30/21 1459   ? ? Education Details cervical exam finding, HEP, POC   ? Person(s) Educated Patient   ? Methods Explanation   ? Comprehension Verbalized understanding   ? ?  ?  ? ?  ? ? ? PT Short Term Goals - 09/30/21 1501   ? ?  ? PT SHORT TERM GOAL #1  ? Title Will be compliant with appropriate HEP to be updated PRN   ? Time 3   ? Period Weeks   ? Status New   ? Target Date 09/23/21   ?  ? PT SHORT TERM GOAL #2  ? Title Pain to be no more than 5/10 at worst   ?  Time 3   ? Period Weeks   ? Status New   ?  ? PT SHORT TERM GOAL #3  ? Title Will have better understanding of functional and postural mechanics   ? Time 3   ? Period Weeks   ? Status New   ?  ? PT SHORT TERM GOAL #4  ?  Title Will be able to perform bed mobility with good mechanics on a consistent basis   ? Time 3   ? Period Weeks   ? Status New   ?  ? PT SHORT TERM GOAL #5  ? Title Cervical ROM to have improved by at least 50% in all planes without increased pain   ? Time 4   ? Period Weeks   ? Status New   ? ?  ?  ? ?  ? ? ? ? PT Long Term Goals - 09/30/21 1502   ? ?  ? PT LONG TERM GOAL #1  ? Title MMT to improve by 1 grade in all weak groups   ? Time 7   ? Period Weeks   ? Status New   ? Target Date 10/21/21   ?  ? PT LONG TERM GOAL #2  ? Title Will demonstrate correct functional lifting mechanical mechanics from ground to waist height   ? Time 7   ? Period Weeks   ? Status New   ?  ? PT LONG TERM GOAL #3  ? Title Will be able to perform functional tasks such as cooking, laundry, etc with back pain no more than 3/10   ? Time 7   ? Period Weeks   ? Status New   ?  ? PT LONG TERM GOAL #4  ? Title Will demonstrate improved core strength by demonsrating ability to maintain plank for 30 seconds   ? Period Weeks   ? Status New   ?  ? PT LONG TERM GOAL #5  ? Title Will be independent with appropriate gym based exercise program   ? Time 7   ? Period Weeks   ? Status New   ?  ? Additional Long Term Goals  ? Additional Long Term Goals Yes   ?  ? PT LONG TERM GOAL #6  ? Title Cervical pain to have improved by at least 50%   ? Time 7   ? Period Weeks   ? Status New   ? ?  ?  ? ?  ? ? ? ? ? ? ? ? Plan - 09/30/21 1500   ? ? Clinical Impression Statement Aaliyha arrives today doing OK, she still had a lot of cramp and spasm after last session. I did look at referral/VA paperwork and it does look like we can work on her neck after this order, proceeded with cervical eval today. Demonstrates a lot of mm spasm and weakness, also postural impairment. I do wonder if we may need to consider upper cervical hypermobility given her past dx of hyper mobility syndrome as well as corresponding symptoms such as insomnia, occasional dizziness, feeling like  she is swallowing a ?lump?, etc. Will reach out to referring MD on this.   ? Personal Factors and Comorbidities Behavior Pattern;Past/Current Experience;Time since onset of injury/illness/exacerbation

## 2021-10-12 ENCOUNTER — Ambulatory Visit: Payer: No Typology Code available for payment source | Admitting: Physical Therapy

## 2021-10-12 ENCOUNTER — Encounter: Payer: Self-pay | Admitting: Physical Therapy

## 2021-10-12 DIAGNOSIS — R293 Abnormal posture: Secondary | ICD-10-CM

## 2021-10-12 DIAGNOSIS — M6281 Muscle weakness (generalized): Secondary | ICD-10-CM

## 2021-10-12 DIAGNOSIS — M5459 Other low back pain: Secondary | ICD-10-CM | POA: Diagnosis not present

## 2021-10-12 NOTE — Therapy (Signed)
[facility] ?[place] ?[address] ?[place], [place], [address] ?Phone: [phone]   Fax:  [phone] ? ?Physical Therapy Treatment ? ?Patient Details  ?Name: Abigail Reynolds ?MRN: [id number] ?Date of Birth: 02-02-1976 ?Referring Provider (PT): Dr. Kathyrn Sheriff ? ? ?Encounter Date: 10/12/2021 ? ? PT End of Session - 10/12/21 1344   ? ? Visit Number 6   ? Date for PT Re-Evaluation 10/21/21   ? PT Start Time 1300   ? PT Stop Time 7035   ? PT Time Calculation (min) 45 min   ? Activity Tolerance Patient tolerated treatment well   ? Behavior During Therapy [facility] for tasks assessed/performed   ? ?  ?  ? ?  ? ? ?Past Medical History:  ?Diagnosis Date  ? Seizures ([place])   ? 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 ? ?Past Surgical History:  ?Procedure Laterality Date  ? BACK SURGERY    ? L4 AND L5 2014  ? CERVICAL SPINE SURGERY    ? C 4 AND C5  ? DILATION AND CURETTAGE OF UTERUS    ? DILITATION & CURRETTAGE/HYSTROSCOPY WITH HYDROTHERMAL ABLATION N/A 01/01/2014  ? Procedure: DILATATION & CURETTAGE/HYSTEROSCOPY WITH attempted HYDROTHERMAL ABLATION;  Surgeon: Frederico Hamman, MD;  Location: [facility];  Service: Gynecology;  Laterality: N/A;  ? ? ?There were no vitals filed for this visit. ? ? Subjective Assessment - 10/12/21 1303   ? ? Subjective We are doing my neck, I have a disk bulge in it. Doing ok   ? Currently in Pain? Yes   ? Pain Score 3    ? Pain Location Neck   ? Pain Orientation Right   ? ?  ?  ? ?  ? ? ? ? ? ? ? ? ? ? ? ? ? ? ? ? ? ? ? ? [facility] Adult PT Treatment/Exercise - 10/12/21 0001   ? ?  ? Neck Exercises: Machines for Strengthening  ? UBE (Upper Arm Bike) L1 x 3 min each   ?  ? Neck Exercises: Seated  ? Other Seated Exercise ER yellow 2x10   ?  ? Neck Exercises: Stretches  ? Levator Stretch Left;Right;3 reps;10 seconds   ?  ? Manual Therapy  ? Manual Therapy Soft tissue mobilization;Passive ROM;Manual Traction   ? Soft tissue mobilization Up, cervical para spinales, Levator    ? Passive ROM Cervical spine   ? Manual Traction cervical 5 x20''   ? ?  ?  ? ?  ? ? ? ? ? ? ? ? ? ? ? ? PT Short Term Goals - 09/30/21 1501   ? ?  ? PT SHORT TERM GOAL #1  ? Title Will be compliant with appropriate HEP to be updated PRN   ? Time 3   ? Period Weeks   ? Status New   ? Target Date 09/23/21   ?  ? PT SHORT TERM GOAL #2  ? Title Pain to be no more than 5/10 at worst   ? Time 3   ? Period Weeks   ? Status New   ?  ? PT SHORT TERM GOAL #3  ? Title Will have better understanding of functional and postural mechanics   ? Time 3   ? Period Weeks   ? Status New   ?  ? PT SHORT TERM GOAL #4  ? Title Will be able to perform bed mobility with good mechanics on a consistent basis   ?  Time 3   ? Period Weeks   ? Status New   ?  ? PT SHORT TERM GOAL #5  ? Title Cervical ROM to have improved by at least 50% in all planes without increased pain   ? Time 4   ? Period Weeks   ? Status New   ? ?  ?  ? ?  ? ? ? ? PT Long Term Goals - 09/30/21 1502   ? ?  ? PT LONG TERM GOAL #1  ? Title MMT to improve by 1 grade in all weak groups   ? Time 7   ? Period Weeks   ? Status New   ? Target Date 10/21/21   ?  ? PT LONG TERM GOAL #2  ? Title Will demonstrate correct functional lifting mechanical mechanics from ground to waist height   ? Time 7   ? Period Weeks   ? Status New   ?  ? PT LONG TERM GOAL #3  ? Title Will be able to perform functional tasks such as cooking, laundry, etc with back pain no more than 3/10   ? Time 7   ? Period Weeks   ? Status New   ?  ? PT LONG TERM GOAL #4  ? Title Will demonstrate improved core strength by demonsrating ability to maintain plank for 30 seconds   ? Period Weeks   ? Status New   ?  ? PT LONG TERM GOAL #5  ? Title Will be independent with appropriate gym based exercise program   ? Time 7   ? Period Weeks   ? Status New   ?  ? Additional Long Term Goals  ? Additional Long Term Goals Yes   ?  ? PT LONG TERM GOAL #6  ? Title Cervical pain to have improved by at least 50%   ? Time 7   ? Period  Weeks   ? Status New   ? ?  ?  ? ?  ? ? ? ? ? ? ? ? Plan - 10/12/21 1344   ? ? Clinical Impression Statement Pt enters feeling ell with some pain alone th R side of her neck. No issues with UBE, she did report some UE fatigue and anterior shoulder discomfort with horizontal abduction. Pt has good PROM of her cervical spine. Positive response to MT evident my improved tissue elasticity.   ? Personal Factors and Comorbidities Behavior Pattern;Past/Current Experience;Time since onset of injury/illness/exacerbation   ? Examination-Activity Limitations Locomotion Level;Transfers;Sit;Squat;Stairs;Stand;Lift   ? Examination-Participation Restrictions Community Activity;Driving;[employer] Work   ? Stability/Clinical Decision Making Stable/Uncomplicated   ? Rehab Potential Good   ? PT Frequency 2x / week   ? PT Treatment/Interventions ADLs/Self Care Home Management;Cryotherapy;Electrical Stimulation;Iontophoresis '4mg'$ /ml Dexamethasone;Moist Heat;Traction;Ultrasound;Functional mobility training;Therapeutic activities;Therapeutic exercise;Balance training;Neuromuscular re-education;Patient/family education;Manual techniques;Passive range of motion;Dry needling;Energy conservation;Taping   ? PT Next Visit Plan dry needling cspine?   ? ?  ?  ? ?  ? ? ?Patient will benefit from skilled therapeutic intervention in order to improve the following deficits and impairments:  Decreased range of motion, Difficulty walking, Increased fascial restricitons, Increased muscle spasms, Decreased activity tolerance, Pain, Impaired perceived functional ability, Hypomobility, Impaired flexibility ? ?Visit Diagnosis: ?Other low back pain ? ?Muscle weakness (generalized) ? ?Abnormal posture ? ? ? ? ?Problem List ?Patient Active Problem List  ? Diagnosis Date Noted  ? Ankle instability 06/28/2021  ? 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 ? ?Newman Nip  Demetries Coia, PTA ?10/12/2021, 1:47 PM ? ?[facility] ?[address] ?[address] ?[place], [place], [address] ?Phone: [phone]   Fax:  [phone] ? ?Name: Abigail Reynolds ?MRN: [id number] ?Date of Birth: [birth year] ? ? ? ?

## 2021-10-19 ENCOUNTER — Ambulatory Visit: Payer: No Typology Code available for payment source | Admitting: Physical Therapy

## 2021-10-19 ENCOUNTER — Encounter: Payer: Self-pay | Admitting: Physical Therapy

## 2021-10-19 DIAGNOSIS — M5459 Other low back pain: Secondary | ICD-10-CM

## 2021-10-19 DIAGNOSIS — M542 Cervicalgia: Secondary | ICD-10-CM

## 2021-10-19 DIAGNOSIS — R29898 Other symptoms and signs involving the musculoskeletal system: Secondary | ICD-10-CM

## 2021-10-19 DIAGNOSIS — R293 Abnormal posture: Secondary | ICD-10-CM

## 2021-10-19 DIAGNOSIS — M6281 Muscle weakness (generalized): Secondary | ICD-10-CM

## 2021-10-19 NOTE — Therapy (Addendum)
[place]. [place], [place], [address] Phone: [phone]   Fax:  [phone]  Physical Therapy Treatment  Patient Details  Name: Abigail Reynolds MRN: [id number] Date of Birth: 1975/09/12 Referring Provider (PT): Dr. Kathyrn Sheriff   Encounter Date: 10/19/2021   PT End of Session - 10/19/21 1442     Visit Number 7    Number of Visits 15    Date for PT Re-Evaluation 11/10/21    Authorization Type VA (approved 1 eval, 14 visits)    Authorization Time Period 09/02/21 to 10/21/21    Authorization - Visit Number 7    Authorization - Number of Visits 14    PT Start Time 1401    PT Stop Time 9381    PT Time Calculation (min) 40 min    Activity Tolerance Patient tolerated treatment well    Behavior During Therapy [facility] for tasks assessed/performed             Past Medical History:  Diagnosis Date   Seizures ([place])    FOCAL SEIZURES  NO IN 2 YEARS    Past Surgical History:  Procedure Laterality Date   BACK SURGERY     L4 AND L5 2014   CERVICAL SPINE SURGERY     C 4 AND C5   DILATION AND CURETTAGE OF UTERUS     DILITATION & CURRETTAGE/HYSTROSCOPY WITH HYDROTHERMAL ABLATION N/A 01/01/2014   Procedure: DILATATION & CURETTAGE/HYSTEROSCOPY WITH attempted HYDROTHERMAL ABLATION;  Surgeon: Frederico Hamman, MD;  Location: [facility];  Service: Gynecology;  Laterality: N/A;    There were no vitals filed for this visit.   Subjective Assessment - 10/19/21 1402     Subjective I have a headache today, stressed as we've been practicing driving with my daughter on back roads. I was hurting after last session and I had to get on the heat but I also did a lot after I left here too    Patient Stated Goals figure out how to get life back, pain/numbness really gets in the way of functional tasks, get back to hiking, take care of neck    Currently in Pain? Yes    Pain Score 4     Pain Location Neck    Pain Orientation Right     Pain Descriptors / Indicators Radiating;Sharp    Pain Type Chronic pain                [facility] PT Assessment - 10/19/21 0001       AROM   Cervical Flexion WNL    Cervical Extension WNL    Cervical - Right Side Bend mild limitation    Cervical - Left Side Bend mild limitation    Cervical - Right Rotation mild limitation    Cervical - Left Rotation mild limitation                           [facility] Adult PT Treatment/Exercise - 10/19/21 0001       Neck Exercises: Machines for Strengthening   UBE (Upper Arm Bike) L1.5 x 3 min forward/3 min backward      Neck Exercises: Supine   Cervical Isometrics Right lateral flexion;Left lateral flexion;Right rotation;Left rotation;Flexion;Extension;3 secs;10 reps    Neck Retraction 10 reps;3 secs      Neck Exercises: Stretches   Upper Trapezius Stretch Right;Left;2 reps;30 seconds    Levator Stretch Right;Left;2 reps;30 seconds  Manual Therapy   Manual Therapy Soft tissue mobilization    Soft tissue mobilization suboccipital release, STM to R UT                    PT Education - 10/19/21 1446     Education Details DN next session    Person(s) Educated Patient    Methods Explanation    Comprehension Verbalized understanding              PT Short Term Goals - 09/30/21 1501       PT SHORT TERM GOAL #1   Title Will be compliant with appropriate HEP to be updated PRN    Time 3    Period Weeks    Status New    Target Date 09/23/21      PT SHORT TERM GOAL #2   Title Pain to be no more than 5/10 at worst    Time 3    Period Weeks    Status New      PT SHORT TERM GOAL #3   Title Will have better understanding of functional and postural mechanics    Time 3    Period Weeks    Status New      PT SHORT TERM GOAL #4   Title Will be able to perform bed mobility with good mechanics on a consistent basis    Time 3    Period Weeks    Status New      PT SHORT TERM GOAL #5   Title Cervical  ROM to have improved by at least 50% in all planes without increased pain    Time 4    Period Weeks    Status New               PT Long Term Goals - 09/30/21 1502       PT LONG TERM GOAL #1   Title MMT to improve by 1 grade in all weak groups    Time 7    Period Weeks    Status New    Target Date 10/21/21      PT LONG TERM GOAL #2   Title Will demonstrate correct functional lifting mechanical mechanics from ground to waist height    Time 7    Period Weeks    Status New      PT LONG TERM GOAL #3   Title Will be able to perform functional tasks such as cooking, laundry, etc with back pain no more than 3/10    Time 7    Period Weeks    Status New      PT LONG TERM GOAL #4   Title Will demonstrate improved core strength by demonsrating ability to maintain plank for 30 seconds    Period Weeks    Status New      PT LONG TERM GOAL #5   Title Will be independent with appropriate gym based exercise program    Time 7    Period Weeks    Status New      Additional Long Term Goals   Additional Long Term Goals Yes      PT LONG TERM GOAL #6   Title Cervical pain to have improved by at least 50%    Time 7    Period Weeks    Status New                   Plan - 10/19/21 1447  Clinical Impression Statement [name] arrives doing OK, she was still having a lot of pain after last session. Warmed up on the nustep then we worked on cervical stretches and postural training in supine, followed by STM to cervical area. Responded well to STM, able to relieve her headache completely and cervical ROM much better after STM too. Will continue efforts, may really benefit from DN next session.    Personal Factors and Comorbidities Behavior Pattern;Past/Current Experience;Time since onset of injury/illness/exacerbation    Examination-Activity Limitations Locomotion Level;Transfers;Sit;Squat;Stairs;Stand;Lift    Examination-Participation Restrictions Community  Activity;Driving;Yard Work    Stability/Clinical Decision Making Stable/Uncomplicated    Designer, jewellery Low    Rehab Potential Good    PT Frequency 2x / week    PT Duration Other (comment)   7w   PT Treatment/Interventions ADLs/Self Care Home Management;Cryotherapy;Electrical Stimulation;Iontophoresis '4mg'$ /ml Dexamethasone;Moist Heat;Traction;Ultrasound;Functional mobility training;Therapeutic activities;Therapeutic exercise;Balance training;Neuromuscular re-education;Patient/family education;Manual techniques;Passive range of motion;Dry needling;Energy conservation;Taping    PT Next Visit Plan Reassess/recert and DN    PT Home Exercise Plan Rows,[email or web address]    Consulted and Agree with Plan of Care Patient             Patient will benefit from skilled therapeutic intervention in order to improve the following deficits and impairments:  Decreased range of motion, Difficulty walking, Increased fascial restricitons, Increased muscle spasms, Decreased activity tolerance, Pain, Impaired perceived functional ability, Hypomobility, Impaired flexibility  Visit Diagnosis: Other low back pain  Abnormal posture  Muscle weakness (generalized)  Other symptoms and signs involving the musculoskeletal system  Cervicalgia     Problem List Patient Active Problem List   Diagnosis Date Noted   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 [place], [place], [address] Phone: [phone]   Fax:  [phone]  Name: LYLLIAN GAUSE MRN: [id number] Date of Birth: [birth year]

## 2021-11-02 ENCOUNTER — Ambulatory Visit: Payer: No Typology Code available for payment source | Admitting: Physical Therapy

## 2021-11-10 ENCOUNTER — Ambulatory Visit: Payer: No Typology Code available for payment source | Admitting: Physical Therapy

## 2021-11-17 ENCOUNTER — Ambulatory Visit: Payer: No Typology Code available for payment source | Attending: Neurosurgery | Admitting: Physical Therapy

## 2021-11-17 DIAGNOSIS — M542 Cervicalgia: Secondary | ICD-10-CM | POA: Insufficient documentation

## 2021-11-17 DIAGNOSIS — M6281 Muscle weakness (generalized): Secondary | ICD-10-CM | POA: Insufficient documentation

## 2021-11-17 DIAGNOSIS — R293 Abnormal posture: Secondary | ICD-10-CM | POA: Insufficient documentation

## 2021-11-17 DIAGNOSIS — M5459 Other low back pain: Secondary | ICD-10-CM | POA: Insufficient documentation

## 2021-11-25 ENCOUNTER — Encounter: Payer: Self-pay | Admitting: Physical Therapy

## 2021-11-25 ENCOUNTER — Ambulatory Visit: Payer: No Typology Code available for payment source | Admitting: Physical Therapy

## 2021-11-25 DIAGNOSIS — M6281 Muscle weakness (generalized): Secondary | ICD-10-CM | POA: Diagnosis present

## 2021-11-25 DIAGNOSIS — M542 Cervicalgia: Secondary | ICD-10-CM

## 2021-11-25 DIAGNOSIS — R293 Abnormal posture: Secondary | ICD-10-CM | POA: Diagnosis present

## 2021-11-25 DIAGNOSIS — M5459 Other low back pain: Secondary | ICD-10-CM | POA: Diagnosis present

## 2021-11-25 NOTE — Therapy (Signed)
[place]. [place], [place], [address] Phone: [phone]   Fax:  [phone]  Physical Therapy Treatment  Patient Details  Name: ELESHA THEDFORD MRN: [id number] Date of Birth: September 05, 1975 Referring Provider (PT): Dr. Kathyrn Sheriff   Encounter Date: 11/25/2021   PT End of Session - 11/25/21 1423     Visit Number 8    PT Start Time 5830    PT Stop Time 9407    PT Time Calculation (min) 40 min    Activity Tolerance Patient tolerated treatment well    Behavior During Therapy [facility] for tasks assessed/performed             Past Medical History:  Diagnosis Date   Seizures ([place])    FOCAL SEIZURES  NO IN 2 YEARS    Past Surgical History:  Procedure Laterality Date   BACK SURGERY     L4 AND L5 2014   CERVICAL SPINE SURGERY     C 4 AND C5   DILATION AND CURETTAGE OF UTERUS     DILITATION & CURRETTAGE/HYSTROSCOPY WITH HYDROTHERMAL ABLATION N/A 01/01/2014   Procedure: DILATATION & CURETTAGE/HYSTEROSCOPY WITH attempted HYDROTHERMAL ABLATION;  Surgeon: Frederico Hamman, MD;  Location: [facility];  Service: Gynecology;  Laterality: N/A;    There were no vitals filed for this visit.   Subjective Assessment - 11/25/21 1349     Subjective Shoulder and neck, been away out of the country and got sick. Had "ESI" neck injection.    Currently in Pain? Yes    Pain Score 5    "Function as a 5"               [facility] PT Assessment - 11/25/21 0001       AROM   Cervical Flexion WNL    Cervical Extension WNL    Cervical - Right Side Bend limited 50%    Cervical - Left Side Bend Limited 25%    Cervical - Right Rotation Limited 25%    Cervical - Left Rotation Limited 50%      Strength   Right Shoulder Flexion 4/5    Right Shoulder ABduction 4-/5    Right Shoulder Internal Rotation 4-/5    Left Shoulder Flexion 4/5    Left Shoulder ABduction 4-/5    Left Shoulder Internal Rotation 4-/5    Left Shoulder External  Rotation 4-/5                           [facility] Adult PT Treatment/Exercise - 11/25/21 0001       Neck Exercises: Standing   Other Standing Exercises Rows & Ext red 2x10    Other Standing Exercises Shoulder ER yellow 2x10      Manual Therapy   Manual Therapy Soft tissue mobilization    Soft tissue mobilization UT, cervical para spinales, Levator    Passive ROM Cervical spine                       PT Short Term Goals - 09/30/21 1501       PT SHORT TERM GOAL #1   Title Will be compliant with appropriate HEP to be updated PRN    Time 3    Period Weeks    Status New    Target Date 09/23/21      PT SHORT TERM GOAL #2   Title Pain to be no more than  5/10 at worst    Time 3    Period Weeks    Status New      PT SHORT TERM GOAL #3   Title Will have better understanding of functional and postural mechanics    Time 3    Period Weeks    Status New      PT SHORT TERM GOAL #4   Title Will be able to perform bed mobility with good mechanics on a consistent basis    Time 3    Period Weeks    Status New      PT SHORT TERM GOAL #5   Title Cervical ROM to have improved by at least 50% in all planes without increased pain    Time 4    Period Weeks    Status New               PT Long Term Goals - 11/25/21 1428       PT LONG TERM GOAL #1   Title MMT to improve by 1 grade in all weak groups    Status On-going      PT LONG TERM GOAL #2   Title Will demonstrate correct functional lifting mechanical mechanics from ground to waist height    Status On-going      PT LONG TERM GOAL #3   Title Will be able to perform functional tasks such as cooking, laundry, etc with back pain no more than 3/10    Status On-going      PT LONG TERM GOAL #4   Title Will demonstrate improved core strength by demonsrating ability to maintain plank for 30 seconds    Status On-going                   Plan - 11/25/21 1424     Clinical Impression  Statement Pt returns to PT after a month. She reports being out of the country and being sick as the reason for her absence. She doe continues to report neck and shoulder pain. Some tissue density noted in the upper traps. Cervical ROM and UE strength doe have some limitations. increase anterior shoulder pain reported with external rotation. She reports responding well to therapy prior to leaving the country.    Personal Factors and Comorbidities Behavior Pattern;Past/Current Experience;Time since onset of injury/illness/exacerbation    Examination-Activity Limitations Locomotion Level;Transfers;Sit;Squat;Stairs;Stand;Lift    Examination-Participation Restrictions Community Activity;Driving;Yard Work    Stability/Clinical Decision Making Stable/Uncomplicated    Rehab Potential Good    PT Frequency 2x / week    PT Duration Other (comment)    PT Treatment/Interventions ADLs/Self Care Home Management;Cryotherapy;Electrical Stimulation;Iontophoresis '4mg'$ /ml Dexamethasone;Moist Heat;Traction;Ultrasound;Functional mobility training;Therapeutic activities;Therapeutic exercise;Balance training;Neuromuscular re-education;Patient/family education;Manual techniques;Passive range of motion;Dry needling;Energy conservation;Taping    PT Next Visit Plan DN             Patient will benefit from skilled therapeutic intervention in order to improve the following deficits and impairments:  Decreased range of motion, Difficulty walking, Increased fascial restricitons, Increased muscle spasms, Decreased activity tolerance, Pain, Impaired perceived functional ability, Hypomobility, Impaired flexibility  Visit Diagnosis: Other low back pain  Abnormal posture  Muscle weakness (generalized)  Cervicalgia     Problem List Patient Active Problem List   Diagnosis Date Noted   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  [place], [place], [address] Phone: [phone]   Fax:  [phone]  Name: ALISHA BACUS MRN: [id number] Date of Birth: 11-09-1975

## 2021-12-14 ENCOUNTER — Ambulatory Visit: Payer: No Typology Code available for payment source | Admitting: Physical Therapy

## 2021-12-21 ENCOUNTER — Encounter: Payer: Self-pay | Admitting: Physical Therapy

## 2021-12-21 ENCOUNTER — Ambulatory Visit: Payer: No Typology Code available for payment source | Attending: Neurosurgery | Admitting: Physical Therapy

## 2021-12-21 DIAGNOSIS — R293 Abnormal posture: Secondary | ICD-10-CM | POA: Insufficient documentation

## 2021-12-21 DIAGNOSIS — M5459 Other low back pain: Secondary | ICD-10-CM | POA: Diagnosis present

## 2021-12-21 DIAGNOSIS — M542 Cervicalgia: Secondary | ICD-10-CM | POA: Insufficient documentation

## 2021-12-21 DIAGNOSIS — R29898 Other symptoms and signs involving the musculoskeletal system: Secondary | ICD-10-CM | POA: Diagnosis present

## 2021-12-21 DIAGNOSIS — M6281 Muscle weakness (generalized): Secondary | ICD-10-CM | POA: Insufficient documentation

## 2021-12-21 NOTE — Therapy (Signed)
[place]. [place], [place], [address] Phone: [phone]   Fax:  [phone]  Physical Therapy Treatment  Patient Details  Name: Abigail Reynolds MRN: [id number] Date of Birth: 27-Apr-1976 Referring Provider (PT): Dr. Kathyrn Sheriff   Encounter Date: 12/21/2021   PT End of Session - 12/21/21 1805     Visit Number 9    Date for PT Re-Evaluation 11/10/21    Authorization Type [facility] (approved 1 eval, 14 visits)    PT Start Time 8182    PT Stop Time 1840    PT Time Calculation (min) 45 min    Activity Tolerance Patient tolerated treatment well    Behavior During Therapy WFL for tasks assessed/performed             Past Medical History:  Diagnosis Date   Seizures ([place])    FOCAL SEIZURES  NO IN 2 YEARS    Past Surgical History:  Procedure Laterality Date   BACK SURGERY     L4 AND L5 2014   CERVICAL SPINE SURGERY     C 4 AND C5   DILATION AND CURETTAGE OF UTERUS     DILITATION & CURRETTAGE/HYSTROSCOPY WITH HYDROTHERMAL ABLATION N/A 01/01/2014   Procedure: DILATATION & CURETTAGE/HYSTEROSCOPY WITH attempted HYDROTHERMAL ABLATION;  Surgeon: Frederico Hamman, MD;  Location: [facility];  Service: Gynecology;  Laterality: N/A;    There were no vitals filed for this visit.   Subjective Assessment - 12/21/21 1832     Subjective Patient reports no change in the neck, still hurting and tight, reports the back hurts less, she reports that she has been sleeping and sitting in a hospital chair as her mom has been in the hospital and now is in a SNF, she reports stress and a lot of sitting    Currently in Pain? Yes    Pain Score 7     Pain Location Neck   and back   Pain Descriptors / Indicators Sore;Spasm;Tightness    Pain Relieving Factors heat helps                               [facility] Adult PT Treatment/Exercise - 12/21/21 0001       Lumbar Exercises: Supine   Other Supine Lumbar Exercises  feet on ball K2C, trunk rotaiton, small bridges and isometric abs all increased pain in the back and the neck      Modalities   Modalities Electrical Stimulation;Moist Heat      Moist Heat Therapy   Number Minutes Moist Heat 10 Minutes    Moist Heat Location Cervical;Lumbar Spine      Electrical Stimulation   Electrical Stimulation Location c/t area    Electrical Stimulation Action IFC    Electrical Stimulation Parameters supine    Electrical Stimulation Goals Pain      Manual Therapy   Soft tissue mobilization UT, cervical para spinales, Levator    Manual Traction tried manual sheet traction for the lumbar this caused pain in the back                       PT Short Term Goals - 09/30/21 1501       PT SHORT TERM GOAL #1   Title Will be compliant with appropriate HEP to be updated PRN    Time 3    Period Weeks    Status New  Target Date 09/23/21      PT SHORT TERM GOAL #2   Title Pain to be no more than 5/10 at worst    Time 3    Period Weeks    Status New      PT SHORT TERM GOAL #3   Title Will have better understanding of functional and postural mechanics    Time 3    Period Weeks    Status New      PT SHORT TERM GOAL #4   Title Will be able to perform bed mobility with good mechanics on a consistent basis    Time 3    Period Weeks    Status New      PT SHORT TERM GOAL #5   Title Cervical ROM to have improved by at least 50% in all planes without increased pain    Time 4    Period Weeks    Status New               PT Long Term Goals - 12/21/21 1834       PT LONG TERM GOAL #1   Title MMT to improve by 1 grade in all weak groups    Status On-going      PT LONG TERM GOAL #2   Title Will demonstrate correct functional lifting mechanical mechanics from ground to waist height    Status On-going      PT LONG TERM GOAL #3   Title Will be able to perform functional tasks such as cooking, laundry, etc with back pain no more than 3/10     Status On-going      PT LONG TERM GOAL #4   Title Will demonstrate improved core strength by demonsrating ability to maintain plank for 30 seconds    Status On-going      PT LONG TERM GOAL #5   Title Will be independent with appropriate gym based exercise program    Status On-going                   Plan - 12/21/21 1836     Clinical Impression Statement Patient s mom got sick and was admitted to the hospital and then to a nursing home, patient has been travelling to sit and stay with her mom and has had to sleep in a chair many nights.  She is reporting more neck and back pain.  She did not tolerate much exercise or activity due to the pain, reports that she will see MD next week.  She is very tight in the upper traps the rhomboids and the neck area    PT Next Visit Plan could try other options see what the MD says    Consulted and Agree with Plan of Care Patient             Patient will benefit from skilled therapeutic intervention in order to improve the following deficits and impairments:  Decreased range of motion, Difficulty walking, Increased fascial restricitons, Increased muscle spasms, Decreased activity tolerance, Pain, Impaired perceived functional ability, Hypomobility, Impaired flexibility  Visit Diagnosis: Other low back pain  Abnormal posture  Muscle weakness (generalized)  Cervicalgia  Other symptoms and signs involving the musculoskeletal system     Problem List Patient Active Problem List   Diagnosis Date Noted   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 [place], [place], [address] Phone: [phone]   Fax:  [phone]  Name: Abigail Reynolds MRN: [id number] Date of Birth: April 03, 1976

## 2021-12-23 ENCOUNTER — Ambulatory Visit: Payer: No Typology Code available for payment source | Admitting: Physical Therapy

## 2022-03-03 ENCOUNTER — Other Ambulatory Visit: Payer: Self-pay | Admitting: Neurosurgery

## 2022-03-04 ENCOUNTER — Other Ambulatory Visit: Payer: Self-pay | Admitting: Neurosurgery

## 2022-03-23 ENCOUNTER — Emergency Department (HOSPITAL_COMMUNITY)
Admission: EM | Admit: 2022-03-23 | Discharge: 2022-03-24 | Discharge status: Against Medical Advice | Payer: No Typology Code available for payment source | Attending: Emergency Medicine | Admitting: Emergency Medicine

## 2022-03-23 ENCOUNTER — Encounter (HOSPITAL_COMMUNITY): Payer: Self-pay | Admitting: Emergency Medicine

## 2022-03-23 DIAGNOSIS — H5711 Ocular pain, right eye: Secondary | ICD-10-CM | POA: Insufficient documentation

## 2022-03-23 DIAGNOSIS — Z5321 Procedure and treatment not carried out due to patient leaving prior to being seen by health care provider: Secondary | ICD-10-CM | POA: Insufficient documentation

## 2022-03-23 NOTE — ED Provider Triage Note (Addendum)
Emergency Medicine Provider Triage Evaluation Note  Abigail Reynolds , a 46 y.o. female  was evaluated in triage.  Pt complains of new onset right eye pain.  States he fell like she had "something stuck in it" yesterday, tried to flush it out several times with minimal relief.  States pain is located more in the upper area of the eye socket towards the brow bone.  Denies fever, eye discharge, bleeding from the eye, or recent trauma/injury to the area.  Denies vision loss or vision change  Review of Systems  Positive:  Negative: See above  Physical Exam  BP 120/72 (BP Location: Right Arm)   Pulse 83   Temp 98.7 F (37.1 C) (Oral)   Resp 18   LMP 09/29/2014   SpO2 100%  Gen:   Awake, no distress   Resp:  Normal effort  MSK:   Moves extremities without difficulty  Other:  Photophobia appreciated.  Mild tenderness of the surrounding area to the eye.  Mild surrounding injection appreciated.  Gaze aligned appropriately.  PERRLA  Medical Decision Making  Medically screening exam initiated at 6:33 PM.  Appropriate orders placed.  Abigail Reynolds was informed that the remainder of the evaluation will be completed by another provider, this initial triage assessment does not replace that evaluation, and the importance of remaining in the ED until their evaluation is complete.     Abigail Rome, PA-C 78/29/56 2130    Abigail Rome, PA-C 86/57/84 1834

## 2022-03-23 NOTE — ED Triage Notes (Signed)
Patient reports right eye pain onset of yesterday. Today having redness. Denies any drainage. Has tried warm compresses. Denies any injury.

## 2022-03-23 NOTE — ED Notes (Signed)
Pt left AMA °

## 2022-03-31 ENCOUNTER — Other Ambulatory Visit: Payer: Self-pay | Admitting: Neurosurgery

## 2022-05-25 NOTE — Progress Notes (Signed)
Surgical Instructions    Your procedure is scheduled on Friday January 5th.  Report to [facility] Main Entrance "A" at 5:30 A.M., then check in with the Admitting office.  Call this number if you have problems the morning of surgery:  [phone]   If you have any questions prior to your surgery date call [phone]: Open Monday-Friday 8am-4pm If you experience any cold or flu symptoms such as cough, fever, chills, shortness of breath, etc. between now and your scheduled surgery, please notify us at the above number     Remember:  Do not eat or drink after midnight the night before your surgery     Take these medicines the morning of surgery with A SIP OF WATER: cetirizine (ZYRTEC) 10 MG tablet DULoxetine (CYMBALTA) 20 MG capsule  methylPREDNISolone (MEDROL DOSEPAK) 4 MG TBPK tablet  (If still taking ok to take on day of surgery)   IF NEEDED  oxyCODONE-acetaminophen (PERCOCET/ROXICET) 5-325 MG per tablet   As of today, STOP taking any Aspirin (unless otherwise instructed by your surgeon) Aleve, Naproxen, Ibuprofen, Motrin, Advil, Goody's, BC's, all herbal medications, fish oil, and all vitamins.           Do not wear jewelry or makeup. Do not wear lotions, powders, perfumes or deodorant. Do not shave 48 hours prior to surgery.  Do not bring valuables to the hospital. Do not wear nail polish, gel polish, artificial nails, or any other type of covering on natural nails (fingers and toes) If you have artificial nails or gel coating that need to be removed by a nail salon, please have this removed prior to surgery. Artificial nails or gel coating may interfere with anesthesia's ability to adequately monitor your vital signs.  [facility] is not responsible for any belongings or valuables.    Do NOT Smoke (Tobacco/Vaping)  24 hours prior to your procedure  If you use a CPAP at night, you may bring your mask for your overnight stay.   Contacts, glasses, hearing aids, dentures or  partials may not be worn into surgery, please bring cases for these belongings   For patients admitted to the hospital, discharge time will be determined by your treatment team.   Patients discharged the day of surgery will not be allowed to drive home, and someone needs to stay with them for 24 hours.   SURGICAL WAITING ROOM VISITATION Patients having surgery or a procedure may have no more than 2 support people in the waiting area - these visitors may rotate.   Children under the age of 77 must have an adult with them who is not the patient. If the patient needs to stay at the hospital during part of their recovery, the visitor guidelines for inpatient rooms apply. Pre-op nurse will coordinate an appropriate time for 1 support person to accompany patient in pre-op.  This support person may not rotate.   Please refer to [email or web address] for the visitor guidelines for Inpatients (after your surgery is over and you are in a regular room).    Special instructions:    Oral Hygiene is also important to reduce your risk of infection.  Remember - BRUSH YOUR TEETH THE MORNING OF SURGERY WITH YOUR REGULAR TOOTHPASTE   [facility]- Preparing For Surgery  Before surgery, you can play an important role. Because skin is not sterile, your skin needs to be as free of germs as possible. You can reduce the number of germs on your skin by washing with CHG (chlorahexidine gluconate)  Soap before surgery.  CHG is an antiseptic cleaner which kills germs and bonds with the skin to continue killing germs even after washing.     Please do not use if you have an allergy to CHG or antibacterial soaps. If your skin becomes reddened/irritated stop using the CHG.  Do not shave (including legs and underarms) for at least 48 hours prior to first CHG shower. It is OK to shave your face.  Please follow these instructions carefully.     Shower the NIGHT BEFORE  SURGERY and the MORNING OF SURGERY with CHG Soap.   If you chose to wash your hair, wash your hair first as usual with your normal shampoo. After you shampoo, rinse your hair and body thoroughly to remove the shampoo.  Then [employer] and genitals (private parts) with your normal soap and rinse thoroughly to remove soap.  After that Use CHG Soap as you would any other liquid soap. You can apply CHG directly to the skin and wash gently with a scrungie or a clean washcloth.   Apply the CHG Soap to your body ONLY FROM THE NECK DOWN.  Do not use on open wounds or open sores. Avoid contact with your eyes, ears, mouth and genitals (private parts). Wash Face and genitals (private parts)  with your normal soap.   Wash thoroughly, paying special attention to the area where your surgery will be performed.  Thoroughly rinse your body with warm water from the neck down.  DO NOT shower/wash with your normal soap after using and rinsing off the CHG Soap.  Pat yourself dry with a CLEAN TOWEL.  Wear CLEAN PAJAMAS to bed the night before surgery  Place CLEAN SHEETS on your bed the night before your surgery  DO NOT SLEEP WITH PETS.   Day of Surgery:  Take a shower with CHG soap. Wear Clean/Comfortable clothing the morning of surgery Do not apply any deodorants/lotions.   Remember to brush your teeth WITH YOUR REGULAR TOOTHPASTE.    If you received a COVID test during your pre-op visit, it is requested that you wear a mask when out in public, stay away from anyone that may not be feeling well, and notify your surgeon if you develop symptoms. If you have been in contact with anyone that has tested positive in the last 10 days, please notify your surgeon.    Please read over the following fact sheets that you were given.

## 2022-05-26 ENCOUNTER — Other Ambulatory Visit: Payer: Self-pay

## 2022-05-26 ENCOUNTER — Encounter (HOSPITAL_COMMUNITY): Payer: Self-pay

## 2022-05-26 ENCOUNTER — Encounter (HOSPITAL_COMMUNITY)
Admission: RE | Admit: 2022-05-26 | Discharge: 2022-05-26 | Disposition: A | Discharge status: Home / Self Care | Payer: No Typology Code available for payment source | Source: Ambulatory Visit | Attending: Neurosurgery | Admitting: Neurosurgery

## 2022-05-26 VITALS — BP 123/81 | HR 81 | Temp 97.8°F | Resp 17 | Ht 66.5 in | Wt 178.0 lb

## 2022-05-26 DIAGNOSIS — Z01818 Encounter for other preprocedural examination: Secondary | ICD-10-CM | POA: Insufficient documentation

## 2022-05-26 HISTORY — DX: Depression, unspecified: F32.A

## 2022-05-26 HISTORY — DX: Gastro-esophageal reflux disease without esophagitis: K21.9

## 2022-05-26 HISTORY — DX: Unspecified osteoarthritis, unspecified site: M19.90

## 2022-05-26 HISTORY — DX: Headache, unspecified: R51.9

## 2022-05-26 HISTORY — DX: Post-traumatic stress disorder, unspecified: F43.10

## 2022-05-26 LAB — CBC
HCT: 38.2 % (ref 36.0–46.0)
Hemoglobin: 12.5 g/dL (ref 12.0–15.0)
MCH: 33.2 pg (ref 26.0–34.0)
MCHC: 32.7 g/dL (ref 30.0–36.0)
MCV: 101.6 fL — ABNORMAL HIGH (ref 80.0–100.0)
Platelets: 225 10*3/uL (ref 150–400)
RBC: 3.76 MIL/uL — ABNORMAL LOW (ref 3.87–5.11)
RDW: 13.4 % (ref 11.5–15.5)
WBC: 5.9 10*3/uL (ref 4.0–10.5)
nRBC: 0 % (ref 0.0–0.2)

## 2022-05-26 LAB — TYPE AND SCREEN
ABO/RH(D): A POS
Antibody Screen: NEGATIVE

## 2022-05-26 LAB — SURGICAL PCR SCREEN
MRSA, PCR: NEGATIVE
Staphylococcus aureus: NEGATIVE

## 2022-05-26 NOTE — Progress Notes (Signed)
Surgical Instructions    Your procedure is scheduled on Friday January 5th.  Report to [facility] Main Entrance "A" at 5:30 A.M., then check in with the Admitting office.  Call this number if you have problems the morning of surgery:  [phone]   If you have any questions prior to your surgery date call [phone]: Open Monday-Friday 8am-4pm If you experience any cold or flu symptoms such as cough, fever, chills, shortness of breath, etc. between now and your scheduled surgery, please notify us at the above number     Remember:  Do not eat or drink after midnight the night before your surgery     Take these medicines the morning of surgery with A SIP OF WATER: cetirizine (ZYRTEC) 10 MG tablet FLUoxetine (PROZAC)    IF NEEDED  oxyCODONE-acetaminophen (PERCOCET/ROXICET) 5-325 MG per tablet  SUMAtriptan (IMITREX)   As of today, STOP taking any Aspirin (unless otherwise instructed by your surgeon) Aleve, Naproxen, Ibuprofen, Motrin, Advil, Goody's, BC's, all herbal medications, fish oil, and all vitamins.           Do not wear jewelry or makeup. Do not wear lotions, powders, perfumes or deodorant. Do not shave 48 hours prior to surgery.  Do not bring valuables to the hospital. Do not wear nail polish, gel polish, artificial nails, or any other type of covering on natural nails (fingers and toes) If you have artificial nails or gel coating that need to be removed by a nail salon, please have this removed prior to surgery. Artificial nails or gel coating may interfere with anesthesia's ability to adequately monitor your vital signs.  [facility] is not responsible for any belongings or valuables.    Do NOT Smoke (Tobacco/Vaping)  24 hours prior to your procedure  If you use a CPAP at night, you may bring your mask for your overnight stay.   Contacts, glasses, hearing aids, dentures or partials may not be worn into surgery, please bring cases for these belongings   For  patients admitted to the hospital, discharge time will be determined by your treatment team.   Patients discharged the day of surgery will not be allowed to drive home, and someone needs to stay with them for 24 hours.   SURGICAL WAITING ROOM VISITATION Patients having surgery or a procedure may have no more than 2 support people in the waiting area - these visitors may rotate.   Children under the age of 64 must have an adult with them who is not the patient. If the patient needs to stay at the hospital during part of their recovery, the visitor guidelines for inpatient rooms apply. Pre-op nurse will coordinate an appropriate time for 1 support person to accompany patient in pre-op.  This support person may not rotate.   Please refer to [email or web address] for the visitor guidelines for Inpatients (after your surgery is over and you are in a regular room).    Special instructions:    Oral Hygiene is also important to reduce your risk of infection.  Remember - BRUSH YOUR TEETH THE MORNING OF SURGERY WITH YOUR REGULAR TOOTHPASTE   [facility]- Preparing For Surgery  Before surgery, you can play an important role. Because skin is not sterile, your skin needs to be as free of germs as possible. You can reduce the number of germs on your skin by washing with CHG (chlorahexidine gluconate) Soap before surgery.  CHG is an antiseptic cleaner which kills germs and bonds with the skin to  continue killing germs even after washing.     Please do not use if you have an allergy to CHG or antibacterial soaps. If your skin becomes reddened/irritated stop using the CHG.  Do not shave (including legs and underarms) for at least 48 hours prior to first CHG shower. It is OK to shave your face.  Please follow these instructions carefully.     Shower the NIGHT BEFORE SURGERY and the MORNING OF SURGERY with CHG Soap.   If you chose to wash your hair, wash  your hair first as usual with your normal shampoo. After you shampoo, rinse your hair and body thoroughly to remove the shampoo.  Then [employer] and genitals (private parts) with your normal soap and rinse thoroughly to remove soap.  After that Use CHG Soap as you would any other liquid soap. You can apply CHG directly to the skin and wash gently with a scrungie or a clean washcloth.   Apply the CHG Soap to your body ONLY FROM THE NECK DOWN.  Do not use on open wounds or open sores. Avoid contact with your eyes, ears, mouth and genitals (private parts). Wash Face and genitals (private parts)  with your normal soap.   Wash thoroughly, paying special attention to the area where your surgery will be performed.  Thoroughly rinse your body with warm water from the neck down.  DO NOT shower/wash with your normal soap after using and rinsing off the CHG Soap.  Pat yourself dry with a CLEAN TOWEL.  Wear CLEAN PAJAMAS to bed the night before surgery  Place CLEAN SHEETS on your bed the night before your surgery  DO NOT SLEEP WITH PETS.   Day of Surgery:  Take a shower with CHG soap. Wear Clean/Comfortable clothing the morning of surgery Do not apply any deodorants/lotions.   Remember to brush your teeth WITH YOUR REGULAR TOOTHPASTE.    If you received a COVID test during your pre-op visit, it is requested that you wear a mask when out in public, stay away from anyone that may not be feeling well, and notify your surgeon if you develop symptoms. If you have been in contact with anyone that has tested positive in the last 10 days, please notify your surgeon.    Please read over the following fact sheets that you were given.

## 2022-05-26 NOTE — Progress Notes (Signed)
PCP - [place]  Cardiologist - pt denies Neurologist: Dr.Cohen at [place] - pt denies Device Orders - n/a Rep Notified - n/a  Chest x-ray - n/a EKG - many years ago-normal per pt Stress Test - pt denies ECHO - pt denies Cardiac Cath - pt denies  Sleep Study - pt denies CPAP - n/a  Fasting Blood Sugar - pt denies Checks Blood Sugar _____ times a day  Last dose of GLP1 agonist-  pt denies GLP1 instructions: n/a  Blood Thinner Instructions:pt denies Aspirin Instructions:pt denies  ERAS Protcol -NPO after MN PRE-SURGERY Ensure or G2- n/a  COVID TEST- n/a   Anesthesia review: NO  Patient denies shortness of breath, fever, cough and chest pain at PAT appointment   All instructions explained to the patient, with a verbal understanding of the material. Patient agrees to go over the instructions while at home for a better understanding. Patient also instructed to self quarantine after being tested for COVID-19. The opportunity to ask questions was provided.

## 2022-06-02 NOTE — Anesthesia Preprocedure Evaluation (Addendum)
Anesthesia Evaluation  Patient identified by MRN, date of birth, ID band Patient awake    Reviewed: Allergy & Precautions, NPO status , Patient's Chart, lab work & pertinent test results  History of Anesthesia Complications Negative for: history of anesthetic complications  Airway Mallampati: II  TM Distance: >3 FB Neck ROM: Full    Dental  (+) Dental Advisory Given   Pulmonary Current Smoker and Patient abstained from smoking.   breath sounds clear to auscultation       Cardiovascular (-) angina negative cardio ROS  Rhythm:Regular Rate:Normal     Neuro/Psych  Headaches, Seizures -, Well Controlled,  PSYCHIATRIC DISORDERS (PTSD) Anxiety Depression       GI/Hepatic Neg liver ROS,GERD  Medicated and Controlled,,  Endo/Other  negative endocrine ROS    Renal/GU negative Renal ROS     Musculoskeletal   Abdominal   Peds  Hematology negative hematology ROS (+)   Anesthesia Other Findings   Reproductive/Obstetrics                              Anesthesia Physical Anesthesia Plan  ASA: 2  Anesthesia Plan: General   Post-op Pain Management: Tylenol PO (pre-op)*   Induction: Intravenous  PONV Risk Score and Plan: 3 and Ondansetron, Dexamethasone and Scopolamine patch - Pre-op  Airway Management Planned: Oral ETT  Additional Equipment: None  Intra-op Plan:   Post-operative Plan: Extubation in OR  Informed Consent: I have reviewed the patients History and Physical, chart, labs and discussed the procedure including the risks, benefits and alternatives for the proposed anesthesia with the patient or authorized representative who has indicated his/her understanding and acceptance.     Dental advisory given  Plan Discussed with: CRNA and Surgeon  Anesthesia Plan Comments:          Anesthesia Quick Evaluation

## 2022-06-03 ENCOUNTER — Inpatient Hospital Stay (HOSPITAL_COMMUNITY): Payer: No Typology Code available for payment source | Admitting: Anesthesiology

## 2022-06-03 ENCOUNTER — Inpatient Hospital Stay (HOSPITAL_COMMUNITY)
Admission: RE | Disposition: A | Discharge status: Home / Self Care | Payer: Self-pay | Source: Home / Self Care | Attending: Neurosurgery

## 2022-06-03 ENCOUNTER — Other Ambulatory Visit: Payer: Self-pay

## 2022-06-03 ENCOUNTER — Encounter (HOSPITAL_COMMUNITY): Payer: Self-pay | Admitting: Neurosurgery

## 2022-06-03 ENCOUNTER — Inpatient Hospital Stay (HOSPITAL_COMMUNITY)
Admission: RE | Admit: 2022-06-03 | Discharge: 2022-06-04 | DRG: 460 | Disposition: A | Discharge status: Home / Self Care | Payer: No Typology Code available for payment source | Attending: Neurosurgery | Admitting: Neurosurgery

## 2022-06-03 ENCOUNTER — Inpatient Hospital Stay (HOSPITAL_COMMUNITY): Payer: No Typology Code available for payment source

## 2022-06-03 DIAGNOSIS — M79605 Pain in left leg: Secondary | ICD-10-CM | POA: Diagnosis not present

## 2022-06-03 DIAGNOSIS — M48061 Spinal stenosis, lumbar region without neurogenic claudication: Secondary | ICD-10-CM | POA: Diagnosis present

## 2022-06-03 DIAGNOSIS — F17200 Nicotine dependence, unspecified, uncomplicated: Secondary | ICD-10-CM | POA: Diagnosis present

## 2022-06-03 DIAGNOSIS — F4310 Post-traumatic stress disorder, unspecified: Secondary | ICD-10-CM | POA: Diagnosis present

## 2022-06-03 DIAGNOSIS — M47816 Spondylosis without myelopathy or radiculopathy, lumbar region: Secondary | ICD-10-CM | POA: Diagnosis not present

## 2022-06-03 DIAGNOSIS — M4316 Spondylolisthesis, lumbar region: Principal | ICD-10-CM | POA: Diagnosis present

## 2022-06-03 DIAGNOSIS — Z91040 Latex allergy status: Secondary | ICD-10-CM

## 2022-06-03 DIAGNOSIS — K219 Gastro-esophageal reflux disease without esophagitis: Secondary | ICD-10-CM | POA: Diagnosis present

## 2022-06-03 DIAGNOSIS — Z885 Allergy status to narcotic agent status: Secondary | ICD-10-CM | POA: Diagnosis not present

## 2022-06-03 DIAGNOSIS — F32A Depression, unspecified: Secondary | ICD-10-CM | POA: Diagnosis present

## 2022-06-03 DIAGNOSIS — Z79899 Other long term (current) drug therapy: Secondary | ICD-10-CM | POA: Diagnosis not present

## 2022-06-03 LAB — ABO/RH: ABO/RH(D): A POS

## 2022-06-03 SURGERY — POSTERIOR LUMBAR FUSION 2 LEVEL
Anesthesia: General

## 2022-06-03 MED ORDER — DEXAMETHASONE SODIUM PHOSPHATE 10 MG/ML IJ SOLN
INTRAMUSCULAR | Status: AC
  Filled 2022-06-03: qty 1

## 2022-06-03 MED ORDER — DOCUSATE SODIUM 100 MG PO CAPS
100.0000 mg | ORAL_CAPSULE | Freq: Two times a day (BID) | ORAL | Status: DC
  Administered 2022-06-03 – 2022-06-04 (×3): 100 mg via ORAL
  Filled 2022-06-03 (×3): qty 1

## 2022-06-03 MED ORDER — PHENYLEPHRINE HCL (PRESSORS) 10 MG/ML IV SOLN
INTRAVENOUS | Status: DC | PRN
  Administered 2022-06-03: 160 ug via INTRAVENOUS

## 2022-06-03 MED ORDER — MEPERIDINE HCL 25 MG/ML IJ SOLN: 6.2500 mg | INTRAMUSCULAR | Status: DC | PRN

## 2022-06-03 MED ORDER — MIDAZOLAM HCL 2 MG/2ML IJ SOLN
INTRAMUSCULAR | Status: AC
  Filled 2022-06-03: qty 2

## 2022-06-03 MED ORDER — ROCURONIUM BROMIDE 10 MG/ML (PF) SYRINGE
PREFILLED_SYRINGE | INTRAVENOUS | Status: AC
  Filled 2022-06-03: qty 10

## 2022-06-03 MED ORDER — HYDROMORPHONE HCL 1 MG/ML IJ SOLN
INTRAMUSCULAR | Status: AC
  Administered 2022-06-03: 0.25 mg via INTRAVENOUS
  Filled 2022-06-03: qty 1

## 2022-06-03 MED ORDER — ACETAMINOPHEN 325 MG PO TABS
650.0000 mg | ORAL_TABLET | ORAL | Status: DC | PRN
  Administered 2022-06-04: 650 mg via ORAL
  Filled 2022-06-03: qty 2

## 2022-06-03 MED ORDER — METHOCARBAMOL 500 MG PO TABS
500.0000 mg | ORAL_TABLET | Freq: Four times a day (QID) | ORAL | Status: DC | PRN
  Administered 2022-06-03 – 2022-06-04 (×3): 500 mg via ORAL
  Filled 2022-06-03 (×3): qty 1

## 2022-06-03 MED ORDER — OXYCODONE HCL 5 MG PO TABS: 5.0000 mg | ORAL_TABLET | Freq: Once | ORAL | Status: AC | PRN

## 2022-06-03 MED ORDER — THROMBIN 5000 UNITS EX SOLR
OROMUCOSAL | Status: DC | PRN
  Administered 2022-06-03 (×2): 5 mL via TOPICAL

## 2022-06-03 MED ORDER — SODIUM CHLORIDE 0.9 % IV SOLN: INTRAVENOUS | Status: DC

## 2022-06-03 MED ORDER — ZONISAMIDE 100 MG PO CAPS
400.0000 mg | ORAL_CAPSULE | Freq: Every day | ORAL | Status: DC
  Administered 2022-06-03: 400 mg via ORAL
  Filled 2022-06-03 (×2): qty 4

## 2022-06-03 MED ORDER — SCOPOLAMINE 1 MG/3DAYS TD PT72
MEDICATED_PATCH | TRANSDERMAL | Status: AC
  Administered 2022-06-03: 1.5 mg via TRANSDERMAL
  Filled 2022-06-03: qty 1

## 2022-06-03 MED ORDER — LIDOCAINE 2% (20 MG/ML) 5 ML SYRINGE
INTRAMUSCULAR | Status: AC
  Filled 2022-06-03: qty 5

## 2022-06-03 MED ORDER — BISACODYL 10 MG RE SUPP: 10.0000 mg | Freq: Every day | RECTAL | Status: DC | PRN

## 2022-06-03 MED ORDER — MIDAZOLAM HCL 2 MG/2ML IJ SOLN: 0.5000 mg | Freq: Once | INTRAMUSCULAR | Status: DC | PRN

## 2022-06-03 MED ORDER — PROPOFOL 10 MG/ML IV BOLUS
INTRAVENOUS | Status: DC | PRN
  Administered 2022-06-03: 150 mg via INTRAVENOUS

## 2022-06-03 MED ORDER — FAMOTIDINE 20 MG PO TABS
40.0000 mg | ORAL_TABLET | Freq: Every day | ORAL | Status: DC
  Administered 2022-06-03: 40 mg via ORAL
  Filled 2022-06-03: qty 2

## 2022-06-03 MED ORDER — CEFAZOLIN SODIUM-DEXTROSE 2-4 GM/100ML-% IV SOLN
INTRAVENOUS | Status: AC
  Filled 2022-06-03: qty 100

## 2022-06-03 MED ORDER — CHLORHEXIDINE GLUCONATE CLOTH 2 % EX PADS: 6.0000 | MEDICATED_PAD | Freq: Once | CUTANEOUS | Status: DC

## 2022-06-03 MED ORDER — PHENOL 1.4 % MT LIQD: 1.0000 | OROMUCOSAL | Status: DC | PRN

## 2022-06-03 MED ORDER — ORAL CARE MOUTH RINSE: 15.0000 mL | Freq: Once | OROMUCOSAL | Status: DC

## 2022-06-03 MED ORDER — ACETAMINOPHEN 500 MG PO TABS: 1000.0000 mg | ORAL_TABLET | Freq: Once | ORAL | Status: AC

## 2022-06-03 MED ORDER — MIDAZOLAM HCL 2 MG/2ML IJ SOLN
INTRAMUSCULAR | Status: DC | PRN
  Administered 2022-06-03: 2 mg via INTRAVENOUS

## 2022-06-03 MED ORDER — HYDROMORPHONE HCL 1 MG/ML IJ SOLN
1.0000 mg | INTRAMUSCULAR | Status: DC | PRN
  Administered 2022-06-03: 1 mg via INTRAVENOUS
  Filled 2022-06-03: qty 1

## 2022-06-03 MED ORDER — HYDROXYZINE HCL 25 MG PO TABS
25.0000 mg | ORAL_TABLET | Freq: Three times a day (TID) | ORAL | Status: DC | PRN
  Administered 2022-06-03: 25 mg via ORAL
  Filled 2022-06-03: qty 1

## 2022-06-03 MED ORDER — HYDROMORPHONE HCL 1 MG/ML IJ SOLN
INTRAMUSCULAR | Status: AC
  Filled 2022-06-03: qty 0.5

## 2022-06-03 MED ORDER — LACTATED RINGERS IV SOLN: INTRAVENOUS | Status: DC

## 2022-06-03 MED ORDER — METHOCARBAMOL 1000 MG/10ML IJ SOLN: 500.0000 mg | Freq: Four times a day (QID) | INTRAVENOUS | Status: DC | PRN

## 2022-06-03 MED ORDER — OXYCODONE HCL 5 MG PO TABS
10.0000 mg | ORAL_TABLET | ORAL | Status: DC | PRN
  Administered 2022-06-03 – 2022-06-04 (×5): 10 mg via ORAL
  Filled 2022-06-03 (×5): qty 2

## 2022-06-03 MED ORDER — FENTANYL CITRATE (PF) 250 MCG/5ML IJ SOLN
INTRAMUSCULAR | Status: DC | PRN
  Administered 2022-06-03: 100 ug via INTRAVENOUS
  Administered 2022-06-03 (×3): 50 ug via INTRAVENOUS

## 2022-06-03 MED ORDER — ONDANSETRON HCL 4 MG/2ML IJ SOLN
INTRAMUSCULAR | Status: AC
  Filled 2022-06-03: qty 2

## 2022-06-03 MED ORDER — OXYCODONE HCL 5 MG PO TABS
ORAL_TABLET | ORAL | Status: AC
  Administered 2022-06-03: 5 mg via ORAL
  Filled 2022-06-03: qty 1

## 2022-06-03 MED ORDER — LIDOCAINE 2% (20 MG/ML) 5 ML SYRINGE
INTRAMUSCULAR | Status: DC | PRN
  Administered 2022-06-03: 60 mg via INTRAVENOUS

## 2022-06-03 MED ORDER — PANTOPRAZOLE SODIUM 40 MG IV SOLR
40.0000 mg | Freq: Every day | INTRAVENOUS | Status: DC
  Administered 2022-06-03: 40 mg via INTRAVENOUS
  Filled 2022-06-03: qty 10

## 2022-06-03 MED ORDER — PHENYLEPHRINE HCL-NACL 20-0.9 MG/250ML-% IV SOLN
INTRAVENOUS | Status: DC | PRN
  Administered 2022-06-03: 30 ug/min via INTRAVENOUS

## 2022-06-03 MED ORDER — HYDROMORPHONE HCL 1 MG/ML IJ SOLN
0.2500 mg | INTRAMUSCULAR | Status: DC | PRN
  Administered 2022-06-03: 0.25 mg via INTRAVENOUS

## 2022-06-03 MED ORDER — SUMATRIPTAN SUCCINATE 100 MG PO TABS: 100.0000 mg | ORAL_TABLET | ORAL | Status: DC | PRN

## 2022-06-03 MED ORDER — THROMBIN 5000 UNITS EX SOLR
CUTANEOUS | Status: AC
  Filled 2022-06-03: qty 5000

## 2022-06-03 MED ORDER — MENTHOL 3 MG MT LOZG: 1.0000 | LOZENGE | OROMUCOSAL | Status: DC | PRN

## 2022-06-03 MED ORDER — DEXAMETHASONE SODIUM PHOSPHATE 10 MG/ML IJ SOLN
INTRAMUSCULAR | Status: DC | PRN
  Administered 2022-06-03: 10 mg via INTRAVENOUS

## 2022-06-03 MED ORDER — FLUOXETINE HCL 20 MG PO CAPS
30.0000 mg | ORAL_CAPSULE | Freq: Every day | ORAL | Status: DC
  Administered 2022-06-03 – 2022-06-04 (×2): 30 mg via ORAL
  Filled 2022-06-03 (×3): qty 1

## 2022-06-03 MED ORDER — CEFAZOLIN SODIUM-DEXTROSE 2-4 GM/100ML-% IV SOLN
2.0000 g | Freq: Three times a day (TID) | INTRAVENOUS | Status: AC
  Administered 2022-06-03 – 2022-06-04 (×2): 2 g via INTRAVENOUS
  Filled 2022-06-03 (×2): qty 100

## 2022-06-03 MED ORDER — PROMETHAZINE HCL 25 MG/ML IJ SOLN: 6.2500 mg | INTRAMUSCULAR | Status: DC | PRN

## 2022-06-03 MED ORDER — FENTANYL CITRATE (PF) 250 MCG/5ML IJ SOLN
INTRAMUSCULAR | Status: AC
  Filled 2022-06-03: qty 5

## 2022-06-03 MED ORDER — TRAZODONE HCL 50 MG PO TABS
50.0000 mg | ORAL_TABLET | Freq: Every day | ORAL | Status: DC
  Administered 2022-06-03: 50 mg via ORAL
  Filled 2022-06-03: qty 1

## 2022-06-03 MED ORDER — SUGAMMADEX SODIUM 200 MG/2ML IV SOLN
INTRAVENOUS | Status: DC | PRN
  Administered 2022-06-03: 200 mg via INTRAVENOUS

## 2022-06-03 MED ORDER — PHENYLEPHRINE HCL (PRESSORS) 10 MG/ML IV SOLN
INTRAVENOUS | Status: AC
  Filled 2022-06-03: qty 1

## 2022-06-03 MED ORDER — OXYCODONE HCL 5 MG PO TABS: 5.0000 mg | ORAL_TABLET | ORAL | Status: DC | PRN

## 2022-06-03 MED ORDER — BUPIVACAINE HCL (PF) 0.5 % IJ SOLN
INTRAMUSCULAR | Status: AC
  Filled 2022-06-03: qty 30

## 2022-06-03 MED ORDER — SODIUM CHLORIDE 0.9% FLUSH: 3.0000 mL | INTRAVENOUS | Status: DC | PRN

## 2022-06-03 MED ORDER — ROCURONIUM BROMIDE 10 MG/ML (PF) SYRINGE
PREFILLED_SYRINGE | INTRAVENOUS | Status: DC | PRN
  Administered 2022-06-03: 20 mg via INTRAVENOUS
  Administered 2022-06-03: 40 mg via INTRAVENOUS
  Administered 2022-06-03: 20 mg via INTRAVENOUS
  Administered 2022-06-03: 10 mg via INTRAVENOUS
  Administered 2022-06-03: 60 mg via INTRAVENOUS
  Administered 2022-06-03: 20 mg via INTRAVENOUS

## 2022-06-03 MED ORDER — BUPIVACAINE HCL (PF) 0.5 % IJ SOLN
INTRAMUSCULAR | Status: DC | PRN
  Administered 2022-06-03: 5 mL

## 2022-06-03 MED ORDER — LIDOCAINE-EPINEPHRINE 1 %-1:100000 IJ SOLN
INTRAMUSCULAR | Status: DC | PRN
  Administered 2022-06-03: 5 mL

## 2022-06-03 MED ORDER — 0.9 % SODIUM CHLORIDE (POUR BTL) OPTIME
TOPICAL | Status: DC | PRN
  Administered 2022-06-03: 1000 mL

## 2022-06-03 MED ORDER — ACETAMINOPHEN 650 MG RE SUPP: 650.0000 mg | RECTAL | Status: DC | PRN

## 2022-06-03 MED ORDER — ONDANSETRON HCL 4 MG/2ML IJ SOLN: 4.0000 mg | Freq: Four times a day (QID) | INTRAMUSCULAR | Status: DC | PRN

## 2022-06-03 MED ORDER — DIPHENHYDRAMINE HCL 50 MG/ML IJ SOLN
INTRAMUSCULAR | Status: DC | PRN
  Administered 2022-06-03: 12.5 mg via INTRAVENOUS

## 2022-06-03 MED ORDER — ONDANSETRON HCL 4 MG PO TABS: 4.0000 mg | ORAL_TABLET | Freq: Four times a day (QID) | ORAL | Status: DC | PRN

## 2022-06-03 MED ORDER — HYDROMORPHONE HCL 1 MG/ML IJ SOLN
INTRAMUSCULAR | Status: DC | PRN
  Administered 2022-06-03 (×2): .25 mg via INTRAVENOUS
  Administered 2022-06-03: .5 mg via INTRAVENOUS

## 2022-06-03 MED ORDER — LIDOCAINE-EPINEPHRINE 1 %-1:100000 IJ SOLN
INTRAMUSCULAR | Status: AC
  Filled 2022-06-03: qty 1

## 2022-06-03 MED ORDER — CHLORHEXIDINE GLUCONATE 0.12 % MT SOLN
OROMUCOSAL | Status: AC
  Filled 2022-06-03: qty 15

## 2022-06-03 MED ORDER — ACETAMINOPHEN 500 MG PO TABS
ORAL_TABLET | ORAL | Status: AC
  Administered 2022-06-03: 1000 mg via ORAL
  Filled 2022-06-03: qty 2

## 2022-06-03 MED ORDER — ONDANSETRON HCL 4 MG/2ML IJ SOLN
INTRAMUSCULAR | Status: DC | PRN
  Administered 2022-06-03: 4 mg via INTRAVENOUS

## 2022-06-03 MED ORDER — SENNA 8.6 MG PO TABS
1.0000 | ORAL_TABLET | Freq: Two times a day (BID) | ORAL | Status: DC
  Administered 2022-06-03 – 2022-06-04 (×3): 8.6 mg via ORAL
  Filled 2022-06-03 (×3): qty 1

## 2022-06-03 MED ORDER — SODIUM CHLORIDE 0.9% FLUSH
3.0000 mL | Freq: Two times a day (BID) | INTRAVENOUS | Status: DC
  Administered 2022-06-03: 3 mL via INTRAVENOUS

## 2022-06-03 MED ORDER — PROPOFOL 10 MG/ML IV BOLUS
INTRAVENOUS | Status: AC
  Filled 2022-06-03: qty 20

## 2022-06-03 MED ORDER — OXYCODONE HCL 5 MG/5ML PO SOLN: 5.0000 mg | Freq: Once | ORAL | Status: AC | PRN

## 2022-06-03 MED ORDER — CHLORHEXIDINE GLUCONATE 0.12 % MT SOLN: 15.0000 mL | Freq: Once | OROMUCOSAL | Status: DC

## 2022-06-03 MED ORDER — CEFAZOLIN SODIUM-DEXTROSE 2-4 GM/100ML-% IV SOLN
2.0000 g | INTRAVENOUS | Status: AC
  Administered 2022-06-03 (×2): 2 g via INTRAVENOUS

## 2022-06-03 MED ORDER — SCOPOLAMINE 1 MG/3DAYS TD PT72: 1.0000 | MEDICATED_PATCH | TRANSDERMAL | Status: DC

## 2022-06-03 MED ORDER — SODIUM CHLORIDE 0.9 % IV SOLN: 250.0000 mL | INTRAVENOUS | Status: DC

## 2022-06-03 SURGICAL SUPPLY — 72 items
ADH SKN CLS APL DERMABOND .7 (GAUZE/BANDAGES/DRESSINGS) ×1
APL SKNCLS STERI-STRIP NONHPOA (GAUZE/BANDAGES/DRESSINGS)
BAG COUNTER SPONGE SURGICOUNT (BAG) ×1 IMPLANT
BAG SPNG CNTER NS LX DISP (BAG) ×1
BASKET BONE COLLECTION (BASKET) ×1 IMPLANT
BENZOIN TINCTURE PRP APPL 2/3 (GAUZE/BANDAGES/DRESSINGS) IMPLANT
BLADE BONE MILL MEDIUM (MISCELLANEOUS) IMPLANT
BLADE CLIPPER SURG (BLADE) IMPLANT
BLADE SURG 11 STRL SS (BLADE) ×1 IMPLANT
BUR MATCHSTICK NEURO 3.0 LAGG (BURR) ×1 IMPLANT
BUR PRECISION FLUTE 5.0 (BURR) ×1 IMPLANT
CAGE INTERBODY PL SHT 7X22.5 (Plate) IMPLANT
CANISTER SUCT 3000ML PPV (MISCELLANEOUS) ×1 IMPLANT
CNTNR URN SCR LID CUP LEK RST (MISCELLANEOUS) ×1 IMPLANT
CONT SPEC 4OZ STRL OR WHT (MISCELLANEOUS) ×1
COVER BACK TABLE 60X90IN (DRAPES) ×1 IMPLANT
DERMABOND ADVANCED .7 DNX12 (GAUZE/BANDAGES/DRESSINGS) ×1 IMPLANT
DRAPE C-ARM 42X72 X-RAY (DRAPES) ×1 IMPLANT
DRAPE C-ARMOR (DRAPES) ×1 IMPLANT
DRAPE LAPAROTOMY 100X72X124 (DRAPES) ×1 IMPLANT
DRAPE SURG 17X23 STRL (DRAPES) ×1 IMPLANT
DURAPREP 26ML APPLICATOR (WOUND CARE) ×1 IMPLANT
ELECT REM PT RETURN 9FT ADLT (ELECTROSURGICAL) ×1
ELECTRODE REM PT RTRN 9FT ADLT (ELECTROSURGICAL) ×1 IMPLANT
GAUZE 4X4 16PLY ~~LOC~~+RFID DBL (SPONGE) IMPLANT
GAUZE SPONGE 4X4 12PLY STRL (GAUZE/BANDAGES/DRESSINGS) IMPLANT
GLOVE BIO SURGEON STRL SZ7.5 (GLOVE) IMPLANT
GLOVE BIOGEL PI IND STRL 7.5 (GLOVE) ×2 IMPLANT
GLOVE ECLIPSE 7.0 STRL STRAW (GLOVE) ×2 IMPLANT
GLOVE EXAM NITRILE XL STR (GLOVE) IMPLANT
GLOVE SURG SS PI 7.0 STRL IVOR (GLOVE) IMPLANT
GLOVE SURG SS PI 7.5 STRL IVOR (GLOVE) IMPLANT
GOWN STRL REUS W/ TWL LRG LVL3 (GOWN DISPOSABLE) ×2 IMPLANT
GOWN STRL REUS W/ TWL XL LVL3 (GOWN DISPOSABLE) IMPLANT
GOWN STRL REUS W/TWL 2XL LVL3 (GOWN DISPOSABLE) IMPLANT
GOWN STRL REUS W/TWL LRG LVL3 (GOWN DISPOSABLE) ×4
GOWN STRL REUS W/TWL XL LVL3 (GOWN DISPOSABLE)
GRAFT BONE PROTEIOS SM 1CC (Orthopedic Implant) IMPLANT
HEMOSTAT POWDER KIT SURGIFOAM (HEMOSTASIS) ×1 IMPLANT
KIT BASIN OR (CUSTOM PROCEDURE TRAY) ×1 IMPLANT
KIT POSITION SURG JACKSON T1 (MISCELLANEOUS) ×1 IMPLANT
KIT TURNOVER KIT B (KITS) ×1 IMPLANT
MILL BONE PREP (MISCELLANEOUS) ×1 IMPLANT
NDL HYPO 18GX1.5 BLUNT FILL (NEEDLE) IMPLANT
NDL SPNL 18GX3.5 QUINCKE PK (NEEDLE) IMPLANT
NEEDLE HYPO 18GX1.5 BLUNT FILL (NEEDLE) IMPLANT
NEEDLE HYPO 22GX1.5 SAFETY (NEEDLE) ×1 IMPLANT
NEEDLE SPNL 18GX3.5 QUINCKE PK (NEEDLE) ×1 IMPLANT
NS IRRIG 1000ML POUR BTL (IV SOLUTION) ×1 IMPLANT
PACK LAMINECTOMY NEURO (CUSTOM PROCEDURE TRAY) ×1 IMPLANT
PAD ARMBOARD 7.5X6 YLW CONV (MISCELLANEOUS) ×3 IMPLANT
PATTIES SURGICAL 1X1 (DISPOSABLE) IMPLANT
PUTTY GRAFTON DBF 6CC W/DELIVE (Putty) IMPLANT
ROD SOLERA 55MM (Rod) IMPLANT
SCREW SET SOLERA (Screw) ×5 IMPLANT
SCREW SET SOLERA TI (Screw) IMPLANT
SCREW SOLERA 6.5X35 (Screw) IMPLANT
SPIKE FLUID TRANSFER (MISCELLANEOUS) ×1 IMPLANT
SPONGE SURGIFOAM ABS GEL 100 (HEMOSTASIS) IMPLANT
SPONGE T-LAP 4X18 ~~LOC~~+RFID (SPONGE) IMPLANT
STRIP CLOSURE SKIN 1/2X4 (GAUZE/BANDAGES/DRESSINGS) IMPLANT
SUT VIC AB 0 CT1 18XCR BRD8 (SUTURE) ×1 IMPLANT
SUT VIC AB 0 CT1 8-18 (SUTURE) ×2
SUT VIC AB 3-0 FS2 27 (SUTURE) IMPLANT
SUT VICRYL 3-0 RB1 18 ABS (SUTURE) ×1 IMPLANT
SYR 3ML LL SCALE MARK (SYRINGE) ×3 IMPLANT
TOWEL GREEN STERILE (TOWEL DISPOSABLE) ×1 IMPLANT
TOWEL GREEN STERILE FF (TOWEL DISPOSABLE) ×1 IMPLANT
TRAY FOL W/BAG SLVR 16FR STRL (SET/KITS/TRAYS/PACK) IMPLANT
TRAY FOLEY MTR SLVR 16FR STAT (SET/KITS/TRAYS/PACK) ×1 IMPLANT
TRAY FOLEY W/BAG SLVR 16FR LF (SET/KITS/TRAYS/PACK) ×1
WATER STERILE IRR 1000ML POUR (IV SOLUTION) ×1 IMPLANT

## 2022-06-03 NOTE — Anesthesia Postprocedure Evaluation (Signed)
Anesthesia Post Note  Patient: Abigail Reynolds  Procedure(s) Performed: POSTERIOR LUMBAR INTERBODY FUSION LUMBAR THREE-FOUR/LUMBAR FOUR-FIVE     Patient location during evaluation: PACU Anesthesia Type: General Level of consciousness: sedated, oriented and patient cooperative Pain management: pain level controlled Vital Signs Assessment: post-procedure vital signs reviewed and stable Respiratory status: spontaneous breathing, nonlabored ventilation and respiratory function stable Cardiovascular status: blood pressure returned to baseline and stable Postop Assessment: no apparent nausea or vomiting Anesthetic complications: no   No notable events documented.  Last Vitals:  Vitals:   06/03/22 1400 06/03/22 1424  BP: 127/76 120/73  Pulse: (!) 108 (!) 105  Resp: 13 20  Temp: 36.6 C 36.6 C  SpO2: 100% 100%    Last Pain:  Vitals:   06/03/22 1424  TempSrc: Oral  PainSc:                  Takeem Krotzer,E. Oday Ridings

## 2022-06-03 NOTE — Transfer of Care (Signed)
Immediate Anesthesia Transfer of Care Note  Patient: Abigail Reynolds  Procedure(s) Performed: POSTERIOR LUMBAR INTERBODY FUSION LUMBAR THREE-FOUR/LUMBAR FOUR-FIVE  Patient Location: PACU  Anesthesia Type:General  Level of Consciousness: awake and patient cooperative  Airway & Oxygen Therapy: Patient Spontanous Breathing and Patient connected to face mask oxygen  Post-op Assessment: Report given to RN, Post -op Vital signs reviewed and stable, and Patient moving all extremities  Post vital signs: Reviewed and stable  Last Vitals:  Vitals Value Taken Time  BP 122/71 06/03/22 1300  Temp    Pulse 117 06/03/22 1304  Resp 11 06/03/22 1304  SpO2 99 % 06/03/22 1304  Vitals shown include unvalidated device data.  Last Pain:  Vitals:   06/03/22 0653  TempSrc:   PainSc: 5       Patients Stated Pain Goal: 3 (67/01/10 0349)  Complications: No notable events documented.

## 2022-06-03 NOTE — Anesthesia Procedure Notes (Signed)
Procedure Name: Intubation Date/Time: 06/03/2022 7:58 AM  Performed by: Elvin So, CRNAPre-anesthesia Checklist: Patient identified, Emergency Drugs available, Suction available and Patient being monitored Patient Re-evaluated:Patient Re-evaluated prior to induction Oxygen Delivery Method: Circle System Utilized Preoxygenation: Pre-oxygenation with 100% oxygen Induction Type: IV induction Ventilation: Mask ventilation without difficulty Laryngoscope Size: Mac and 3 Grade View: Grade II Tube type: Oral Tube size: 7.0 mm Number of attempts: 1 Airway Equipment and Method: Stylet and Oral airway Placement Confirmation: ETT inserted through vocal cords under direct vision, positive ETCO2 and breath sounds checked- equal and bilateral Secured at: 22 cm Tube secured with: Tape Dental Injury: Teeth and Oropharynx as per pre-operative assessment

## 2022-06-03 NOTE — Progress Notes (Signed)
Orthopedic Tech Progress Note Patient Details:  Abigail Reynolds 13-Feb-1976 [id number]  Ortho Devices Type of Ortho Device: Lumbar corsett Ortho Device/Splint Interventions: Ordered  LSO back brace left with patient    Chip Boer 06/03/2022, 4:30 PM

## 2022-06-03 NOTE — Brief Op Note (Signed)
  NEUROSURGERY BRIEF OPERATIVE NOTE   PREOP DX: Lumbar spondylolisthesis, L4-5, Lumbar spondylosis L3-4, L4-5  POSTOP DX: Same  PROCEDURE: PLIF L3-4, L4-5  SURGEON: Dr. Consuella Lose, MD  ASSISTANT: Dr. Duffy Rhody, MD  ANESTHESIA: GETA  EBL: 300cc  SPECIMENS: None  DRAINS: None  COMPLICATIONS: None immediate  CONDITION: Hemodynamically stable to PACU   Consuella Lose, MD [facility]

## 2022-06-03 NOTE — H&P (Signed)
Chief Complaint   Back and leg pain  History of Present Illness  Abigail Reynolds is a 47 y.o. female who has been followed in the outpatient neurosurgery clinic for several months with progressive back and bilateral leg pain and paresthesias.  Symptoms are worsened with standing and walking.  Her imaging is revealed significant stenosis at L3-4 and L4-5.  She has attempted multiple different conservative treatments including physical therapy, anti-inflammatory medications, and a series of epidural steroid injections without lasting improvement.  She therefore presents today for surgical decompression and fusion.  Past Medical History   Past Medical History:  Diagnosis Date   Arthritis    Depression    GERD (gastroesophageal reflux disease)    Headache    PTSD (post-traumatic stress disorder)    Seizures ([place])    FOCAL SEIZURES  NO IN 2 YEARS "last sezuire over 5 years ago" per pt    Past Surgical History   Past Surgical History:  Procedure Laterality Date   BACK SURGERY     L4 AND L5 2014   CERVICAL SPINE SURGERY     C 4 AND C5   COLONOSCOPY     DILATION AND CURETTAGE OF UTERUS     DILITATION & CURRETTAGE/HYSTROSCOPY WITH HYDROTHERMAL ABLATION N/A 01/01/2014   Procedure: DILATATION & CURETTAGE/HYSTEROSCOPY WITH attempted HYDROTHERMAL ABLATION;  Surgeon: Frederico Hamman, MD;  Location: [facility];  Service: Gynecology;  Laterality: N/A;   LASIK     TONSILLECTOMY     TUBAL LIGATION      Social History   Social History   Tobacco Use   Smoking status: Never   Smokeless tobacco: Never  Vaping Use   Vaping Use: Never used  Substance Use Topics   Alcohol use: Yes    Alcohol/week: 4.0 - 7.0 standard drinks of alcohol    Types: 4 - 7 Glasses of wine per week    Comment: sometimes drinks wine 1 glass every night then may not drink for weeks-Social   Drug use: No    Medications   Prior to Admission medications   Medication Sig Start Date End Date Taking?  Authorizing Provider  Calcium Carb-Cholecalciferol (CALCIUM + VITAMIN D3 PO) Take 1 tablet by mouth daily.   Yes [provider]  cetirizine (ZYRTEC) 10 MG tablet Take 10 mg by mouth daily.   Yes [provider]  COLLAGEN PO Take 1 capsule by mouth daily.   Yes [provider]  cyanocobalamin (VITAMIN B12) 1000 MCG tablet Take 1,000 mcg by mouth daily.   Yes [provider]  cyclobenzaprine (FLEXERIL) 10 MG tablet Take 10 mg by mouth at bedtime.   Yes [provider]  famotidine (PEPCID) 40 MG tablet Take 40 mg by mouth every evening.   Yes [provider]  Flaxseed, Linseed, (FLAXSEED OIL) 1000 MG CAPS Take 1,000 mg by mouth daily.   Yes [provider]  FLUoxetine (PROZAC) 10 MG capsule Take 30 mg by mouth daily.   Yes [provider]  naproxen (NAPROSYN) 500 MG tablet Take 500 mg by mouth 2 (two) times daily as needed for moderate pain.   Yes [provider]  Olopatadine HCl (EYE ALLERGY ITCH RELIEF) 0.2 % SOLN Place 1 drop into both eyes as needed (itchy eyes).   Yes [provider]  SUMAtriptan (IMITREX) 100 MG tablet Take 100 mg by mouth every 2 (two) hours as needed for migraine. May repeat in 2 hours if headache persists or recurs.  Yes [provider]  traZODone (DESYREL) 50 MG tablet Take 50 mg by mouth at bedtime.   Yes [provider]  vitamin E 180 MG (400 UNITS) capsule Take 400 Units by mouth daily.   Yes [provider]  zonisamide (ZONEGRAN) 100 MG capsule Take 400 mg by mouth at bedtime.   Yes [provider]  diclofenac Sodium (VOLTAREN) 1 % GEL Apply 1 Application topically 3 (three) times daily as needed (joint pain).    [provider]  promethazine (PHENERGAN) 25 MG tablet Take 25 mg by mouth every 6 (six) hours as needed for nausea or vomiting.    [provider]    Allergies   Allergies  Allergen Reactions   Latex Hives and  Itching   Morphine Hives and Itching   Vicodin [Hydrocodone-Acetaminophen] Hives, Itching and Rash    Review of Systems  ROS  Neurologic Exam  Awake, alert, oriented Memory and concentration grossly intact Speech fluent, appropriate CN grossly intact Motor exam: Upper Extremities Deltoid Bicep Tricep Grip  Right 5/5 5/5 5/5 5/5  Left 5/5 5/5 5/5 5/5   Lower Extremities IP Quad PF DF EHL  Right 5/5 5/5 5/5 5/5 5/5  Left 5/5 5/5 5/5 5/5 5/5   Sensation grossly intact to LT Impression  - 47 y.o. female with progressive back and leg pain related to multifactorial stenosis at L3-4 and L4-5 who has failed reasonable conservative treatment.  Plan  -We will plan on proceeding with decompression and fusion L3-4 L4-5  I have reviewed the indications for the procedure as well as the details of the procedure and the expected postoperative course and recovery at length with the patient in the office. We have also reviewed in detail the risks, benefits, and alternatives to the procedure. All questions were answered and Rayonna E Jordan-Ferris provided informed consent to proceed.  Consuella Lose, MD [facility]

## 2022-06-04 MED ORDER — GABAPENTIN 300 MG PO CAPS: 300.0000 mg | ORAL_CAPSULE | Freq: Three times a day (TID) | ORAL | 2 refills | Status: AC

## 2022-06-04 MED ORDER — OXYCODONE-ACETAMINOPHEN 5-325 MG PO TABS: 1.0000 | ORAL_TABLET | ORAL | 0 refills | Status: AC | PRN

## 2022-06-04 MED ORDER — DOCUSATE SODIUM 100 MG PO CAPS: 100.0000 mg | ORAL_CAPSULE | Freq: Two times a day (BID) | ORAL | 2 refills | Status: AC

## 2022-06-04 MED ORDER — CYCLOBENZAPRINE HCL 10 MG PO TABS: 10.0000 mg | ORAL_TABLET | Freq: Three times a day (TID) | ORAL | 2 refills | Status: DC | PRN

## 2022-06-04 MED ORDER — CYCLOBENZAPRINE HCL 10 MG PO TABS: 10.0000 mg | ORAL_TABLET | Freq: Three times a day (TID) | ORAL | 2 refills | Status: AC | PRN

## 2022-06-04 MED ORDER — GABAPENTIN 300 MG PO CAPS
300.0000 mg | ORAL_CAPSULE | Freq: Three times a day (TID) | ORAL | Status: DC
  Administered 2022-06-04: 300 mg via ORAL
  Filled 2022-06-04: qty 1

## 2022-06-04 MED ORDER — DOCUSATE SODIUM 100 MG PO CAPS: 100.0000 mg | ORAL_CAPSULE | Freq: Two times a day (BID) | ORAL | 2 refills | Status: DC

## 2022-06-04 MED ORDER — OXYCODONE-ACETAMINOPHEN 5-325 MG PO TABS: 1.0000 | ORAL_TABLET | ORAL | 0 refills | Status: DC | PRN

## 2022-06-04 NOTE — Progress Notes (Signed)
Subjective: The patient is alert and pleasant.  She does not feel she is ready to go home yet.  She complains of back pain and left leg pain.  Objective: Vital signs in last 24 hours: Temp:  [97.8 F (36.6 C)-98.1 F (36.7 C)] 98.1 F (36.7 C) (01/06 0730) Pulse Rate:  [84-123] 100 (01/06 0730) Resp:  [11-20] 18 (01/06 0730) BP: (106-137)/(64-81) 126/68 (01/06 0730) SpO2:  [98 %-100 %] 100 % (01/06 0730) Estimated body mass index is 28.3 kg/m as calculated from the following:   Height as of this encounter: 5' 6.5" (1.689 m).   Weight as of this encounter: 80.7 kg.   Intake/Output from previous day: 01/05 0701 - 01/06 0700 In: 2240 [P.O.:240; I.V.:2000] Out: 1030 [Urine:730; Blood:300] Intake/Output this shift: No intake/output data recorded.  Physical exam the patient is alert and pleasant.  She is moving her lower extremities well.  Lab Results: No results for input(s): "WBC", "HGB", "HCT", "PLT" in the last 72 hours. BMET No results for input(s): "NA", "K", "CL", "CO2", "GLUCOSE", "BUN", "CREATININE", "CALCIUM" in the last 72 hours.  Studies/Results: DG Lumbar Spine 2-3 Views  Result Date: 06/03/2022 CLINICAL DATA:  Posterior lumbar fusion. Fluoroscopy time: 54 seconds. Cumulative air Kerma 38.13 mGy Images: 2 EXAM: LUMBAR SPINE - 2-3 VIEW COMPARISON:  None Available. FINDINGS: Imaging was provided during placement of pedicle rods and screws at L4, L5, and S1. The right-sided L4 screw distal tip appears to extend just above the superior endplate. Hardware is otherwise in good position. Disc spacer devices are noted. IMPRESSION: The right L4 screw distal tip appears to extend just above the superior endplate. Hardware is otherwise in good position. Electronically Signed   By: Dorise Bullion III M.D.   On: 06/03/2022 12:38   DG C-Arm 1-60 Min-No Report  Result Date: 06/03/2022 Fluoroscopy was utilized by the requesting physician.  No radiographic interpretation.   DG C-Arm  1-60 Min-No Report  Result Date: 06/03/2022 Fluoroscopy was utilized by the requesting physician.  No radiographic interpretation.    Assessment/Plan: Postoperative day #1: We will mobilize the patient with PT and OT.  I will add Neurontin for her left leg pain.  In case she wants to go home later on today, I have given her discharge instructions and answered all her questions.  LOS: 1 day     Abigail Reynolds 06/04/2022, 9:24 AM     Patient ID: Abigail Reynolds, female   DOB: Oct 11, 1975, 47 y.o.   MRN: [id number]

## 2022-06-04 NOTE — Discharge Summary (Addendum)
Physician Discharge Summary  Patient ID: Abigail Reynolds MRN: [id number] DOB/AGE: Mar 22, 1976 47 y.o.  Admit date: 06/03/2022 Discharge date: 06/04/2022  Admission Diagnoses:  Lumbar spondylolisthesis  Discharge Diagnoses:  Same Principal Problem:   Spondylolisthesis of lumbar region   Discharged Condition: Stable  Hospital Course:  Abigail Reynolds is a 47 y.o. female who underwent elective L3-4, L4-5 PLIF.  She was admitted to the floor postoperatively.  There she was mobilized with the aid of PT/OT.  Her pain was well-controlled.  She was started on Gabapentin for left leg pain.  She was deemed ready for discharge home on 1/6.  Treatments: Surgery - L3-4, L4-5 PLIF.  Discharge Exam: Blood pressure 126/68, pulse 100, temperature 98.1 F (36.7 C), temperature source Oral, resp. rate 18, height 5' 6.5" (1.689 m), weight 80.7 kg, last menstrual period 09/29/2014, SpO2 100 %. Awake, alert, oriented Speech fluent, appropriate CN grossly intact 5/5 BUE/BLE Wound c/d/i  Disposition: Discharge disposition: 01-Home or Self Care       Discharge Instructions     Incentive spirometry RT   Complete by: As directed       Allergies as of 06/04/2022       Reactions   Latex Hives, Itching   Morphine Hives, Itching   Vicodin [hydrocodone-acetaminophen] Hives, Itching, Rash        Medication List     TAKE these medications    CALCIUM + VITAMIN D3 PO Take 1 tablet by mouth daily.   cetirizine 10 MG tablet Commonly known as: ZYRTEC Take 10 mg by mouth daily.   COLLAGEN PO Take 1 capsule by mouth daily.   cyanocobalamin 1000 MCG tablet Commonly known as: VITAMIN B12 Take 1,000 mcg by mouth daily.   cyclobenzaprine 10 MG tablet Commonly known as: FLEXERIL Take 10 mg by mouth at bedtime. What changed: Another medication with the same name was added. Make sure you understand how and when to take each.   cyclobenzaprine 10 MG tablet Commonly known as:  FLEXERIL Take 1 tablet (10 mg total) by mouth 3 (three) times daily as needed for muscle spasms. What changed: You were already taking a medication with the same name, and this prescription was added. Make sure you understand how and when to take each.   diclofenac Sodium 1 % Gel Commonly known as: VOLTAREN Apply 1 Application topically 3 (three) times daily as needed (joint pain).   docusate sodium 100 MG capsule Commonly known as: Colace Take 1 capsule (100 mg total) by mouth 2 (two) times daily.   Eye Allergy Itch Relief 0.2 % Soln Generic drug: Olopatadine HCl Place 1 drop into both eyes as needed (itchy eyes).   famotidine 40 MG tablet Commonly known as: PEPCID Take 40 mg by mouth every evening.   Flaxseed Oil 1000 MG Caps Take 1,000 mg by mouth daily.   FLUoxetine 10 MG capsule Commonly known as: PROZAC Take 30 mg by mouth daily.   gabapentin 300 MG capsule Commonly known as: NEURONTIN Take 1 capsule (300 mg total) by mouth 3 (three) times daily.   naproxen 500 MG tablet Commonly known as: NAPROSYN Take 500 mg by mouth 2 (two) times daily as needed for moderate pain.   oxyCODONE-acetaminophen 5-325 MG tablet Commonly known as: Percocet Take 1 tablet by mouth every 4 (four) hours as needed for severe pain.   promethazine 25 MG tablet Commonly known as: PHENERGAN Take 25 mg by mouth every 6 (six) hours as needed for nausea or vomiting.  SUMAtriptan 100 MG tablet Commonly known as: IMITREX Take 100 mg by mouth every 2 (two) hours as needed for migraine. May repeat in 2 hours if headache persists or recurs.   traZODone 50 MG tablet Commonly known as: DESYREL Take 50 mg by mouth at bedtime.   vitamin E 180 MG (400 UNITS) capsule Take 400 Units by mouth daily.   zonisamide 100 MG capsule Commonly known as: ZONEGRAN Take 400 mg by mouth at bedtime.               Durable Medical Equipment  (From admission, onward)           Start     Ordered    06/04/22 1109  For home use only DME Walker rolling  Once       Question Answer Comment  Walker: With 5 Inch Wheels   Patient needs a walker to treat with the following condition S/P lumbar spinal fusion      06/04/22 1109   06/04/22 1109  For home use only DME 3 n 1  Once        06/04/22 1109            Follow-up Information     Consuella Lose, MD Follow up in 2 week(s).   Specialty: Neurosurgery Contact information: [address] [place] [address] [phone]                 Signed: Vallarie Mare 06/04/2022, 11:25 AM

## 2022-06-04 NOTE — Care Management (Signed)
Patient with order to DC to home today. Unit staff to provide DME needed for home.   No HH needs identified Patient will have family/ friends provide transportation home. No other TOC needs identified for DC 

## 2022-06-04 NOTE — Progress Notes (Signed)
Patient is discharged from room 3C11 at this time. IV site d/c'd and instructions given to patient and spouse with understanding verbalized and all questions answered. Left unit via wheelchair with all belongings at side.

## 2022-06-04 NOTE — Evaluation (Signed)
Physical Therapy Evaluation Patient Details Name: Abigail Reynolds MRN: [id number] DOB: June 04, 1975 Today's Date: 06/04/2022  History of Present Illness  Pt is a 47 y.o. female admitted 06/03/22 for elective PLIF L3-4, L4-5. PMH includes PTSD, seizures, depression, arthritis, cervical sx, lumbar sx (2014).   Clinical Impression  Pt presents with an overall decrease in functional mobility secondary to above. PTA, pt independent without DME, lives with supportive boyfriend. Initiated educ re: precautions, positioning, brace wear, activity recommendations and importance of mobility. Today, pt able to perform transfer, gait and stair training with RW and intermittent min guard for balance. Pt limited by c/o significant LLE discomfort ("it feels like my leg has fallen asleep and it's not waking up"). Pt would benefit from continued acute PT services to maximize functional mobility and independence prior to d/c home.      Recommendations for follow up therapy are one component of a multi-disciplinary discharge planning process, led by the attending physician.  Recommendations may be updated based on patient status, additional functional criteria and insurance authorization.  Follow Up Recommendations Follow physician's recommendations for discharge plan and follow up therapies      Assistance Recommended at Discharge Intermittent Supervision/Assistance  Patient can return home with the following  A little help with bathing/dressing/bathroom;Assistance with cooking/housework    Equipment Recommendations Rolling walker (2 wheels);BSC/3in1  Recommendations for Other Services  Occupational Therapy     Functional Status Assessment Patient has had a recent decline in their functional status and demonstrates the ability to make significant improvements in function in a reasonable and predictable amount of time.     Precautions / Restrictions Precautions Precautions: Back;Fall Precaution Booklet  Issued: No Required Braces or Orthoses: Spinal Brace Spinal Brace: Lumbar corset;Applied in sitting position Restrictions Weight Bearing Restrictions: No      Mobility  Bed Mobility Overal bed mobility: Needs Assistance Bed Mobility: Rolling, Sidelying to Sit, Sit to Sidelying Rolling: Supervision Sidelying to sit: Supervision     Sit to sidelying: Min assist General bed mobility comments: cues for log roll technique, supervision for supine>sit; minA for LE management with sit to sidelying; pt requiring significant increased time to roll secondary to pain, reports, "this is this hardest part"    Transfers Overall transfer level: Needs assistance Equipment used: Rolling walker (2 wheels) Transfers: Sit to/from Stand Sit to Stand: Supervision           General transfer comment: cues for hand placement    Ambulation/Gait Ambulation/Gait assistance: Min guard, Supervision Gait Distance (Feet): 60 Feet Assistive device: Rolling walker (2 wheels) Gait Pattern/deviations: Step-through pattern, Decreased stride length, Antalgic, Trunk flexed Gait velocity: Decreased     General Gait Details: slow, guarded gait with RW and intermittent min guard for balance; pt frequently stopping to flex L hip/knee secondary to discomfort; deferred further distance to prioritize stair training, limited by pain  Stairs            Wheelchair Mobility    Modified Rankin (Stroke Patients Only)       Balance Overall balance assessment: Needs assistance Sitting-balance support: No upper extremity supported, Feet supported Sitting balance-Leahy Scale: Fair     Standing balance support: No upper extremity supported, During functional activity Standing balance-Leahy Scale: Fair Standing balance comment: can static stand without UE support to adjust brace; preference for RW support  Pertinent Vitals/Pain Pain Assessment Pain Assessment:  Faces Faces Pain Scale: Hurts even more Pain Location: LLE Pain Descriptors / Indicators: Guarding, Grimacing, Numbness, Tingling ("it's like my leg has fallen asleep and not waking up") Pain Intervention(s): Monitored during session, Premedicated before session, Limited activity within patient's tolerance    Home Living Family/patient expects to be discharged to:: Private residence Living Arrangements: Spouse/significant other Available Help at Discharge: Family;Available 24 hours/day Type of Home: House (townhouse) Home Access: Stairs to enter Entrance Stairs-Rails: None Entrance Stairs-Number of Steps: 3-4 long steps (could likely fit walker) Alternate Level Stairs-Number of Steps: flight Home Layout: Two level Home Equipment: Shower seat Additional Comments: husband taking off work first few days to assist at home. reports she could borrow shower seat from family    Prior Function Prior Level of Function : Independent/Modified Independent             Mobility Comments: independent without DME, though mobility has been limited by LE pain       Hand Dominance        Extremity/Trunk Assessment   Upper Extremity Assessment Upper Extremity Assessment: Overall WFL for tasks assessed    Lower Extremity Assessment Lower Extremity Assessment: LLE deficits/detail LLE Deficits / Details: functional strength at least 3/5 though limited by c/o pain, numbness and muscle spasms    Cervical / Trunk Assessment Cervical / Trunk Assessment: Back Surgery  Communication   Communication: No difficulties (quiet voice)  Cognition Arousal/Alertness: Awake/alert Behavior During Therapy: WFL for tasks assessed/performed, Flat affect Overall Cognitive Status: Within Functional Limits for tasks assessed                                          General Comments General comments (skin integrity, edema, etc.): initiated educ re: precautions (pt able to recall 3/3 at  beginning of session), brace wear, positioning, activity recmomendations, DME use, importance of mobility. pt's boyfriend present at beginning of session and supportive    Exercises     Assessment/Plan    PT Assessment Patient needs continued PT services  PT Problem List Decreased strength;Decreased activity tolerance;Decreased balance;Decreased mobility;Decreased knowledge of use of DME;Decreased knowledge of precautions;Pain       PT Treatment Interventions DME instruction;Gait training;Stair training;Functional mobility training;Therapeutic activities;Therapeutic exercise;Patient/family education;Balance training    PT Goals (Current goals can be found in the Care Plan section)  Acute Rehab PT Goals Patient Stated Goal: decreased LLE pain PT Goal Formulation: With patient Time For Goal Achievement: 06/18/22 Potential to Achieve Goals: Good    Frequency 7X/week     Co-evaluation               AM-PAC PT "6 Clicks" Mobility  Outcome Measure Help needed turning from your back to your side while in a flat bed without using bedrails?: A Little Help needed moving from lying on your back to sitting on the side of a flat bed without using bedrails?: A Little Help needed moving to and from a bed to a chair (including a wheelchair)?: A Little Help needed standing up from a chair using your arms (e.g., wheelchair or bedside chair)?: A Little Help needed to walk in hospital room?: A Little Help needed climbing 3-5 steps with a railing? : A Little 6 Click Score: 18    End of Session Equipment Utilized During Treatment: Gait belt;Back brace Activity Tolerance: Patient limited by pain  Patient left: in bed;with call bell/phone within reach Nurse Communication: Mobility status PT Visit Diagnosis: Other abnormalities of gait and mobility (R26.89);Pain    Time: 0981-1914 PT Time Calculation (min) (ACUTE ONLY): 31 min   Charges:   PT Evaluation $PT Eval Low Complexity: 1 Low PT  Treatments $Gait Training: 8-22 mins      Mabeline Caras, PT, DPT Acute Rehabilitation Services  Personal: [facility] Office: [place] 06/04/2022, 9:40 AM

## 2022-06-04 NOTE — Evaluation (Signed)
Occupational Therapy Evaluation Patient Details Name: Abigail Reynolds MRN: [id number] DOB: 02/17/1976 Today's Date: 06/04/2022   History of Present Illness Pt is a 47 y.o. female admitted 06/03/22 for elective PLIF L3-4, L4-5. PMH includes PTSD, seizures, depression, arthritis, cervical sx, lumbar sx (2014).   Clinical Impression   Pt admitted with the above diagnoses and presents with below problem list. Pt will benefit from continued acute OT to address the below listed deficits and maximize independence with basic ADLs prior to d/c home. AT baseline, pt is independent with ADLs. Pt currently needs supervision to min guard assist with functional transfers and mobility; min guard to min A with LB ADLs. Provided back education handout, pt's son and father present.       Recommendations for follow up therapy are one component of a multi-disciplinary discharge planning process, led by the attending physician.  Recommendations may be updated based on patient status, additional functional criteria and insurance authorization.   Follow Up Recommendations  Follow physician's recommendations for discharge plan and follow up therapies     Assistance Recommended at Discharge Set up Supervision/Assistance  Patient can return home with the following A little help with bathing/dressing/bathroom;Help with stairs or ramp for entrance;Assistance with cooking/housework    Functional Status Assessment  Patient has had a recent decline in their functional status and demonstrates the ability to make significant improvements in function in a reasonable and predictable amount of time.  Equipment Recommendations  BSC/3in1    Recommendations for Other Services       Precautions / Restrictions Precautions Precautions: Back;Fall Precaution Booklet Issued: No Required Braces or Orthoses: Spinal Brace Spinal Brace: Lumbar corset;Applied in sitting position Restrictions Weight Bearing Restrictions: No       Mobility Bed Mobility Overal bed mobility: Needs Assistance Bed Mobility: Rolling, Sidelying to Sit Rolling: Supervision Sidelying to sit: Min guard       General bed mobility comments: extra time and effort. Onset of back spasm, success on second attempt to come to full EOB position from sidelying    Transfers Overall transfer level: Needs assistance Equipment used: Rolling walker (2 wheels) Transfers: Sit to/from Stand Sit to Stand: Supervision           General transfer comment: cues for hand placement      Balance Overall balance assessment: Needs assistance Sitting-balance support: No upper extremity supported, Feet supported Sitting balance-Leahy Scale: Fair     Standing balance support: No upper extremity supported, During functional activity Standing balance-Leahy Scale: Fair                             ADL either performed or assessed with clinical judgement   ADL Overall ADL's : Needs assistance/impaired Eating/Feeding: Set up;Sitting   Grooming: Supervision/safety;Set up;Sitting;Standing   Upper Body Bathing: Set up;Sitting   Lower Body Bathing: Sit to/from stand;Min guard   Upper Body Dressing : Set up;Sitting   Lower Body Dressing: Min guard;Sit to/from stand   Toilet Transfer: Min guard;Ambulation;Rolling walker (2 wheels)   Toileting- Clothing Manipulation and Hygiene: Min guard;Sit to/from stand   Tub/ Shower Transfer: Min guard;Ambulation;Shower seat;Rolling walker (2 wheels)   Functional mobility during ADLs: Min guard;Rolling walker (2 wheels) General ADL Comments: compelted LB ADL education including use of AD and strategies with back precautions.     Vision         Perception     Praxis      Pertinent Vitals/Pain  Pain Assessment Pain Assessment: Faces Faces Pain Scale: Hurts even more Pain Location: LLE Pain Descriptors / Indicators: Grimacing, Cramping, Guarding Pain Intervention(s): Limited activity  within patient's tolerance, Monitored during session, Repositioned     Hand Dominance     Extremity/Trunk Assessment Upper Extremity Assessment Upper Extremity Assessment: Overall WFL for tasks assessed   Lower Extremity Assessment Lower Extremity Assessment: Defer to PT evaluation LLE Deficits / Details: functional strength at least 3/5 though limited by c/o pain, numbness and muscle spasms   Cervical / Trunk Assessment Cervical / Trunk Assessment: Back Surgery   Communication Communication Communication: No difficulties (quiet voice)   Cognition Arousal/Alertness: Awake/alert Behavior During Therapy: WFL for tasks assessed/performed, Flat affect Overall Cognitive Status: Within Functional Limits for tasks assessed                                       General Comments  Pt's son and father present for parts of session. Provided back education handout.    Exercises     Shoulder Instructions      Home Living Family/patient expects to be discharged to:: Private residence Living Arrangements: Spouse/significant other Available Help at Discharge: Family;Available 24 hours/day Type of Home: House (townhouse) Home Access: Stairs to enter [employer] of Steps: 3-4 long steps (could likely fit walker) Entrance Stairs-Rails: None Home Layout: Two level Alternate Level Stairs-Number of Steps: flight Alternate Level Stairs-Rails: Right;Left Bathroom Shower/Tub: Teacher, early years/pre: Standard     Home Equipment: Shower seat   Additional Comments: husband taking off work first few days to assist at home. reports she could borrow shower seat from family      Prior Functioning/Environment Prior Level of Function : Independent/Modified Independent             Mobility Comments: independent without DME, though mobility has been limited by LE pain          OT Problem List: Impaired balance (sitting and/or standing);Decreased  knowledge of precautions;Decreased knowledge of use of DME or AE;Pain      OT Treatment/Interventions: Self-care/ADL training;DME and/or AE instruction;Therapeutic activities;Patient/family education;Balance training    OT Goals(Current goals can be found in the care plan section) Acute Rehab OT Goals Patient Stated Goal: not stated OT Goal Formulation: With patient Time For Goal Achievement: 06/18/22 Potential to Achieve Goals: Good ADL Goals Pt Will Perform Grooming: with modified independence;standing Pt Will Perform Lower Body Bathing: with modified independence;sit to/from stand Pt Will Perform Lower Body Dressing: with modified independence;sit to/from stand  OT Frequency: Min 2X/week    Co-evaluation              AM-PAC OT "6 Clicks" Daily Activity     Outcome Measure Help from another person eating meals?: None Help from another person taking care of personal grooming?: None Help from another person toileting, which includes using toliet, bedpan, or urinal?: A Little Help from another person bathing (including washing, rinsing, drying)?: A Little Help from another person to put on and taking off regular upper body clothing?: None Help from another person to put on and taking off regular lower body clothing?: A Little 6 Click Score: 21   End of Session Equipment Utilized During Treatment: Rolling walker (2 wheels);Back brace  Activity Tolerance: Patient tolerated treatment well Patient left: in chair;with call bell/phone within reach;with family/visitor present  OT Visit Diagnosis: Unsteadiness on feet (R26.81);Pain  Time: 4492-0100 OT Time Calculation (min): 34 min Charges:  OT General Charges $OT Visit: 1 Visit OT Evaluation $OT Eval Low Complexity: 1 Low OT Treatments $Self Care/Home Management : 8-22 mins  Tyrone Schimke, OT Acute Rehabilitation Services Pager: [phone] Office: [phone]   Hortencia Pilar 06/04/2022, 12:29 PM

## 2022-06-04 NOTE — Discharge Instructions (Addendum)
     Wound Care Remove dressing in 3 days Leave incision open to air. You may shower. Do not scrub directly on incision.  Do not put any creams, lotions, or ointments on incision. Activity Walk each and every day, increasing distance each day. No lifting greater than 5 lbs.  Avoid bending, Lifting,and twisting. No driving for 2 weeks; may ride as a passenger locally. If provided with back brace, wear when out of bed.  It is not necessary to wear in bed. Diet Resume your normal diet.  Return to Work Will be discussed at you follow up appointment. Call Your Doctor If Any of These Occur Redness, drainage, or swelling at the wound.  Temperature greater than 101 degrees. Severe pain not relieved by pain medication. Incision starts to come apart. Follow Up Appt Call  [phone]) for problems.  If you have any hardware placed in your spine, you will need an x-ray before your appointment.

## 2022-06-06 MED FILL — Thrombin For Soln 5000 Unit: CUTANEOUS | Qty: 5000 | Status: AC

## 2022-06-07 NOTE — Op Note (Signed)
NEUROSURGERY OPERATIVE NOTE   PREOP DIAGNOSIS:  1. Spondylolisthesis L4-5 2. Spondylolsis, L3-4, L4-5  POSTOP DIAGNOSIS: Same  PROCEDURE: 1. L3, L4 laminectomy with facetectomy for decompression of exiting nerve roots, more than would be required for placement of interbody graft 2. Placement of anterior interbody device - Medtronic expandable 52m cage x1 @ L4-5, x2 @ L3-4 3. Posterior segmental instrumentation using cortical pedicle screws at L3 - L5 - Medtronic solera screws 4. Interbody arthrodesis, L3-4, L4-5 5. Use of locally harvested bone autograft 6. Use of non-structural bone allograft - DBM, ProteiOs  SURGEON: Dr. NConsuella Lose MD  ASSISTANT: Dr. JDuffy Rhody MD  ANESTHESIA: General Endotracheal  EBL: 300cc  SPECIMENS: None  DRAINS: None  COMPLICATIONS: None immediate  CONDITION: Hemodynamically stable to PACU  HISTORY: Abigail HAKIMis a 47y.o. female who has been followed in the outpatient clinic with back and leg pain related to spondylosis and spondylolisthesis at L3-4 and L4-5. Multiple conservative treatments were attempted without significant improvement and we ultimately elected to proceed with surgical decompression and fusion. Risks, benefits, and alternative treatments were reviewed in detail in the office. After all questions were answered, informed consent was obtained and witnessed.  PROCEDURE IN DETAIL: The patient was brought to the operating room via stretcher. After induction of general anesthesia, the patient was positioned on the operative table in the prone position. All pressure points were meticulously padded. Incision was then marked out and prepped and draped in the usual sterile fashion.  After timeout was conducted, skin was infiltrated with local anesthetic. Skin incision was then made sharply and Bovie electrocautery was used to dissect the subcutaneous tissue until the lumbodorsal fascia was identified and incised. The  muscle was then elevated in the subperiosteal plane and the L3 and L4 lamina and L3-4, L4-5 facet complexes were identified.  There was a significant amount of scar tissue primarily around the L4-5 interspace on the left side, although no large laminotomy defect was identified.  Self-retaining retractors were then placed. Lateral fluoroscopy was taken with a dissector in the L3-4 interspace to confirm our location.  At this point attention was turned to decompression. Complete L3 and L4 laminectomy was completed with a high-speed drill and Kerrison punches.  Normal dura was identified in the midline.  There was a significant amount of fibrosis in the epidural space, and the dura was especially adherent to the ligamentum flavum and the inferior aspect of the exposure at the L4-5 interspace on the left.  A ball-tipped dissector was then used to identify the foramina bilaterally at L3-4 and L4-5.  High-speed drill was used to cut across the pars interarticularis and the inferior articulating process of L3 and L4 was removed bilaterally.  The epidural space was then carefully dissected, and the exiting nerve roots and the traversing nerve roots were then identified including bilateral L3, L4, and L5 roots.  Kerrison punches were used to remove the medial and superior aspect of the bilateral L4 and L5 superior articulating processes in order to fully decompress the foramina.  I was then able to easily pass a ball dissector in the ventral epidural space and underneath the bilateral L3 and left L4 nerves indicating good decompression.  While I was able to identify the exiting right L4 nerve root and the traversing right L5 nerve root, there was a significant amount of epidural fibrosis precluding dissection of the ventral epidural space at this level.  Disc spaces was then identified, incised bilaterally at L3-4 and  on the left at L4-5, and using a combination of shavers, curettes and rongeurs, complete discectomy at  both levels was completed. Endplates were prepared, and bone harvested during decompression was mixed with DBM and proteiOs and packed into the interspaces. A 40m expandable cage was tapped into place bilaterally at L3-4 and on the left at L4-5. cages were expanded to achieve good endplate apposition.  Good position was confirmed with fluoroscopy.  At this point, the entry points for bilateral L3, L4, and L5 cortical pedicle screws were identified using standard anatomic landmarks and lateral fluoro. Pilot holes were then drilled and tapped to 5.539mat L3 and right L4 and 6.5 at L4 on the left and bilateral L5. Screws were then placed in L3-L4 and L5.  During placement of the 6.5 millimeter screw in the right L4 pedicle, I noted a vertical fracture in the right L4 pedicle.  I therefore remove the screw.  6.5 screws were placed on the left at L4 and bilaterally at L5 without incident. Prebent lordotic rod was then sized and placed into the pedicle screws. Set screws were placed and final tightened. Final AP and lateral fluoroscopic images confirmed good position.  Hemostasis was secured and confirmed with bipolar cautery and morcellized gelfoam with thrombin. The wound was then irrigated with copious amounts of antibiotic saline, then closed in standard fashion using a combination of interrupted 0 and 3-0 Vicryl stitches in the muscular, fascial, and subcutaneous layers. Skin was then closed using standard Dermabond. Sterile dressing was then applied. The patient was then transferred to the stretcher, extubated, and taken to the postanesthesia care unit in stable hemodynamic condition.  At the end of the case all sponge, needle, cottonoid, and instrument counts were correct.   NeConsuella LoseMD [facility]

## 2022-08-09 NOTE — Therapy (Signed)
OUTPATIENT PHYSICAL THERAPY LOWER EXTREMITY EVALUATION   Patient Name: Abigail Reynolds MRN: [id number] DOB:1975-08-12, 47 y.o., female Today's Date: 08/11/2022  END OF SESSION:  PT End of Session - 08/10/22 1637     Visit Number 1    Number of Visits 15    Date for PT Re-Evaluation 10/07/22    Authorization Type [place]    Authorization Time Period 02/16/22 - 08/29/2022    Authorization - Visit Number 1    Authorization - Number of Visits 15    Progress Note Due on Visit 10    PT Start Time 1633    PT Stop Time [id number]    PT Time Calculation (min) 46 min    Activity Tolerance Patient tolerated treatment well    Behavior During Therapy WFL for tasks assessed/performed             Past Medical History:  Diagnosis Date   Arthritis    Depression    GERD (gastroesophageal reflux disease)    Headache    PTSD (post-traumatic stress disorder)    Seizures ([place])    FOCAL SEIZURES  NO IN 2 YEARS "last sezuire over 5 years ago" per pt   Past Surgical History:  Procedure Laterality Date   BACK SURGERY     L4 AND L5 2014   CERVICAL SPINE SURGERY     C 4 AND C5   COLONOSCOPY     DILATION AND CURETTAGE OF UTERUS     DILITATION & CURRETTAGE/HYSTROSCOPY WITH HYDROTHERMAL ABLATION N/A 01/01/2014   Procedure: DILATATION & CURETTAGE/HYSTEROSCOPY WITH attempted HYDROTHERMAL ABLATION;  Surgeon: Frederico Hamman, MD;  Location: [facility];  Service: Gynecology;  Laterality: N/A;   LASIK     TONSILLECTOMY     TUBAL LIGATION     Patient Active Problem List   Diagnosis Date Noted   Spondylolisthesis of lumbar region 06/03/2022   Ankle instability 06/28/2021   Hypermobility syndrome 06/28/2021   Patellofemoral arthritis 06/28/2021    PCP: [facility]   REFERRING PROVIDER: Consuella Lose, MD   REFERRING DIAG: M43.16 (ICD-10-CM) - Spondylolisthesis, lumbar region   THERAPY DIAG:  Chronic midline low back pain with left-sided sciatica  Muscle weakness  (generalized)  Difficulty in walking, not elsewhere classified  Other disturbances of skin sensation  Rationale for Evaluation and Treatment: Rehabilitation  ONSET DATE: POSTERIOR LUMBAR INTERBODY FUSION LUMBAR THREE-FOUR/LUMBAR FOUR-FIVE 06/03/22. Hx of chronic LBP  SUBJECTIVE:   SUBJECTIVE STATEMENT: Pt reports she her low back and L leg pain and N/T have improved since surgery. Pain and N/T can intermittently radiate to the L lateral calf. 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 She is experiencing discomfort and weakness pf both knees. Sometimes her knees buckle with squating and sitting. Pt reports in February when turning to her R when washing her back and she experienced significant pain of her R low back and this area is still intermittently painful and TTP.   PERTINENT HISTORY: Neck pain, Cervical surgery, PTSD, HAs  PAIN:  Are you having pain? Yes: NPRS scale: 5/10 Pain location: low back Pain description: pressure Aggravating factors: prolonged sitting (20 min), standing and walking (30 mins standing and walking) Relieving factors: changing positions, lying down Pain range on 4-9/10  PRECAUTIONS: Back 5# lifting restriction; and No BLT as per pt  WEIGHT BEARING RESTRICTIONS: No  FALLS:  Has patient fallen in last 6 months? 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   Fluoroscopy time: 54 seconds.   Cumulative air Kerma 38.13 mGy   Images: 2   EXAM: LUMBAR SPINE - 2-3 VIEW   COMPARISON:  None Available.   FINDINGS: Imaging was  provided during placement of pedicle rods and screws at L4, L5, and S1. The right-sided L4 screw distal tip appears to extend just above the superior endplate. Hardware is otherwise in good position. Disc spacer devices are noted.   IMPRESSION: The right L4 screw distal tip appears to extend just above the superior endplate. Hardware is otherwise in good position.     On: 06/03/2022 12:38    PATIENT SURVEYS:  FOTO: Perceived function   47%, predicted   54%   COGNITION: Overall cognitive status: Within functional limits for tasks assessed     SENSATION: WFL  EDEMA:             None observed   MUSCLE LENGTH: Hamstrings: Right  50 deg; Left 45 deg Thomas test: Right NT deg; Left NT deg  POSTURE: rounded shoulders and forward head  PALPATION: TTP R low back paraspinals and QL with increase muscle tension  LOWER EXTREMITY MMT: Decreased core strength MMT ROM Right eval Left eval  Hip flexion 3+ 3+  Hip extension 3 3  Hip abduction 3 3  Hip adduction    Hip internal rotation    Hip external rotation 3+ 3+  Knee flexion 4+ 4+  Knee extension 4+ 4+  Ankle dorsiflexion 4 4  Ankle plantarflexion    Ankle inversion    Ankle eversion     (Blank rows = not tested)  LOWER EXTREMITY ROM: Grossly WNLs ROM Right eval Left eval  Hip flexion    Hip extension    Hip abduction    Hip adduction    Hip internal rotation    Hip external rotation    Knee flexion    Knee extension    Ankle dorsiflexion    Ankle plantarflexion    Ankle inversion    Ankle eversion     (Blank rows = not tested)  Lumbar ROM: NT due to pt reported mobility restrictions  LOWER EXTREMITY SPECIAL TESTS:  NT  FUNCTIONAL TESTS:  5 times sit to stand: TBA 2 minute walk test: TBA SLS: TBA  GAIT: Distance walked: 21f Assistive device utilized: None Level of assistance: Complete Independence Comments: Decreased pace   TODAY'S TREATMENT:  [facility] Adult PT Treatment:                                                DATE: 08/10/22 Therapeutic Exercise: Developed, instructed in, and pt completed therex as noted in HEP   PATIENT EDUCATION:  Education details: Eval findings, POC, HEP, self care  Person educated: Patient Education method: Explanation, Demonstration, Tactile cues, Verbal cues, and Handouts Education comprehension: verbalized understanding, returned demonstration, verbal cues required, and tactile cues required  HOME EXERCISE PROGRAM: Access Code: [email or web address] URL: [email or web address] Date: 08/10/2022 Prepared by: Gar Ponto  Exercises - Standing Shoulder Row with Anchored Resistance  - 1-2 x daily - 7 x weekly - 2-3 sets - 10 reps - Shoulder extension with resistance - Neutral  - 1-2 x daily - 7 x weekly - 2-3 sets - 10 reps - Mini Squat with Counter Support  - 1-2 x daily - 7 x weekly - 2-3 sets - 10 reps - Supine Posterior Pelvic Tilt  - 1-2 x daily - 7 x weekly - 2-3 sets - 10 reps - 3 hold - Bent Knee Fallouts  - 1-2 x daily - 7 x weekly - 2-3 sets - 10 reps - 3 hold  ASSESSMENT:  CLINICAL IMPRESSION: Patient is a 48 y.o. female who was seen today for physical therapy evaluation and treatment for M43.16 (ICD-10-CM) - Spondylolisthesis, lumbar region .   OBJECTIVE IMPAIRMENTS: decreased activity tolerance, decreased balance, decreased mobility, difficulty walking, decreased ROM, increased muscle spasms, impaired flexibility, and pain.   ACTIVITY LIMITATIONS: carrying, lifting, bending, sitting, standing, squatting, sleeping, bathing, toileting, dressing, reach over head, locomotion level, and caring for others  PARTICIPATION LIMITATIONS: meal prep, cleaning, laundry, driving, community activity, and occupation  PERSONAL FACTORS: Fitness, Past/current experiences, Profession, and Time since onset of  injury/illness/exacerbation are also affecting patient's functional outcome.   REHAB POTENTIAL: Good  CLINICAL DECISION MAKING: Evolving/moderate complexity  EVALUATION COMPLEXITY: Moderate   GOALS:  SHORT TERM GOALS: Target date: 09/02/22 Pt will be Ind in an initial HEP  Baseline: initiated Goal status: INITIAL  LONG TERM GOALS: Target date: 10/07/22  Pt will be Ind in a final HEP to maintain achieved LOF  Baseline: 10/07/22 Goal status: INITIAL  2.  Improve 5xSTS by MCID of 5" and 2MWT by MCID of 8f as indication of improved functional mobility  Baseline: TBA Goal status: INITIAL  3.  Improve SLS to greater than 15" as demonstration of improved stability/balance  Baseline: TBA Goal status: INITIAL  4.  Improve pt's core strength as demonstrated by maintaining a bridge for 60" and a plank from her knees for 30" Baseline: TBA Goal status: INITIAL  5.  Improve pt's bilat hip strength for 4+/5 for improved stability and function Baseline: see flow sheets Goal status: INITIAL  6.  Pt will report a decrease in LBP and L LE pain to 5/10 or for improved function and QOL Baseline: 4-9/10 Goal status: INITIAL   PLAN:  PT FREQUENCY: 2x/week  PT DURATION: other: 7 weeks  PLANNED INTERVENTIONS: Therapeutic exercises, Therapeutic activity, Balance training, Gait training, Patient/Family education, Self Care, Joint mobilization, Stair training, Aquatic Therapy, Dry Needling, Electrical stimulation, Spinal mobilization, Cryotherapy, Moist heat, Taping, Ionotophoresis '4mg'$ /ml Dexamethasone, Manual therapy, and Re-evaluation  PLAN FOR NEXT SESSION: Review FOTO; assess response to HEP; progress therex as indicated; use of modalities, manual therapy; and  TPDN as indicated. Will contact Dr. Kathyrn Sheriff re: most recent precautions.  Emelina Hinch MS, PT 08/11/22 9:20 AM

## 2022-08-10 ENCOUNTER — Ambulatory Visit: Payer: No Typology Code available for payment source | Attending: Neurosurgery

## 2022-08-10 ENCOUNTER — Other Ambulatory Visit: Payer: Self-pay

## 2022-08-10 DIAGNOSIS — G8929 Other chronic pain: Secondary | ICD-10-CM | POA: Diagnosis present

## 2022-08-10 DIAGNOSIS — R262 Difficulty in walking, not elsewhere classified: Secondary | ICD-10-CM | POA: Insufficient documentation

## 2022-08-10 DIAGNOSIS — M6281 Muscle weakness (generalized): Secondary | ICD-10-CM | POA: Diagnosis present

## 2022-08-10 DIAGNOSIS — M5442 Lumbago with sciatica, left side: Secondary | ICD-10-CM | POA: Insufficient documentation

## 2022-08-10 DIAGNOSIS — M5459 Other low back pain: Secondary | ICD-10-CM | POA: Insufficient documentation

## 2022-08-10 DIAGNOSIS — R208 Other disturbances of skin sensation: Secondary | ICD-10-CM | POA: Insufficient documentation

## 2022-08-17 ENCOUNTER — Telehealth: Payer: Self-pay

## 2022-08-17 ENCOUNTER — Ambulatory Visit: Payer: No Typology Code available for payment source

## 2022-08-17 DIAGNOSIS — M6281 Muscle weakness (generalized): Secondary | ICD-10-CM

## 2022-08-17 DIAGNOSIS — R262 Difficulty in walking, not elsewhere classified: Secondary | ICD-10-CM

## 2022-08-17 DIAGNOSIS — M5442 Lumbago with sciatica, left side: Secondary | ICD-10-CM | POA: Diagnosis not present

## 2022-08-17 DIAGNOSIS — M5459 Other low back pain: Secondary | ICD-10-CM

## 2022-08-17 DIAGNOSIS — R208 Other disturbances of skin sensation: Secondary | ICD-10-CM

## 2022-08-17 NOTE — Telephone Encounter (Signed)
Patient asked if the [facility] will cover her visit for 4/2 Date states 4/1 per PT Zenia Resides, they are in process of getting additional visits

## 2022-08-17 NOTE — Therapy (Signed)
OUTPATIENT PHYSICAL THERAPY TREATMENT NOTE   Patient Name: Abigail Reynolds MRN: [id number] DOB:July 04, 1975, 47 y.o., female Today's Date: 08/17/2022  PCP: [facility]   REFERRING PROVIDER: Consuella Lose, MD   END OF SESSION:   PT End of Session - 08/17/22 1559     Visit Number 2    Number of Visits 15    Date for PT Re-Evaluation 10/07/22    Authorization Type [facility]    Authorization Time Period 02/16/22 - 08/29/2022    Authorization - Visit Number 15    PT Start Time [id number]    PT Stop Time 1640    PT Time Calculation (min) 50 min    Activity Tolerance Patient tolerated treatment well    Behavior During Therapy WFL for tasks assessed/performed             Past Medical History:  Diagnosis Date   Arthritis    Depression    GERD (gastroesophageal reflux disease)    Headache    PTSD (post-traumatic stress disorder)    Seizures ([place])    FOCAL SEIZURES  NO IN 2 YEARS "last sezuire over 5 years ago" per pt   Past Surgical History:  Procedure Laterality Date   BACK SURGERY     L4 AND L5 2014   CERVICAL SPINE SURGERY     C 4 AND C5   COLONOSCOPY     DILATION AND CURETTAGE OF UTERUS     DILITATION & CURRETTAGE/HYSTROSCOPY WITH HYDROTHERMAL ABLATION N/A 01/01/2014   Procedure: DILATATION & CURETTAGE/HYSTEROSCOPY WITH attempted HYDROTHERMAL ABLATION;  Surgeon: Frederico Hamman, MD;  Location: [facility];  Service: Gynecology;  Laterality: N/A;   LASIK     TONSILLECTOMY     TUBAL LIGATION     Patient Active Problem List   Diagnosis Date Noted   Spondylolisthesis of lumbar region 06/03/2022   Ankle instability 06/28/2021   Hypermobility syndrome 06/28/2021   Patellofemoral arthritis 06/28/2021    REFERRING DIAG: M43.16 (ICD-10-CM) - Spondylolisthesis, lumbar region   THERAPY DIAG:  Muscle weakness (generalized)  Difficulty in walking, not elsewhere classified  Other disturbances of skin sensation  Other low back pain  Rationale for Evaluation  and Treatment Rehabilitation  ONSET DATE: POSTERIOR LUMBAR INTERBODY FUSION LUMBAR THREE-FOUR/LUMBAR FOUR-FIVE 06/03/22. Hx of chronic LBP   SUBJECTIVE:    SUBJECTIVE STATEMENT: Pt reports completing her HEP was to painful for her low back so she completed her HEP every other day.   PERTINENT HISTORY: Neck pain, Cervical surgery, PTSD, HAs   PAIN:  Are you having pain? Yes: NPRS scale: 0/10 Pain location: low back Pain description: pressure Aggravating factors: prolonged sitting (20 min), standing and walking (30 mins standing and walking) Relieving factors: changing positions, lying down Pain range on 4-9/10   PRECAUTIONS: Back 5# lifting restriction; and No BLT as per pt. Spoke with Dr. Cleotilde Neer office and the pt's lifting and movement restrictions have been lifted.   WEIGHT BEARING RESTRICTIONS: No   FALLS:  Has patient fallen in last 6 months? 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   Fluoroscopy time: 54 seconds.   Cumulative air Kerma 38.13 mGy   Images: 2   EXAM: LUMBAR SPINE - 2-3 VIEW   COMPARISON:  None Available.   FINDINGS: Imaging was provided during placement of pedicle rods and screws at L4, L5, and S1. The right-sided L4 screw distal tip appears to extend just above the superior endplate. Hardware is otherwise in good position. Disc spacer devices are noted.   IMPRESSION: The right L4 screw distal tip appears to extend just above the superior  endplate. Hardware is otherwise in good position.     On: 06/03/2022 12:38      PATIENT SURVEYS:  FOTO: Perceived function   47%, predicted   54%    COGNITION: Overall cognitive status: Within functional limits for tasks assessed                         SENSATION: WFL   EDEMA:             None observed    MUSCLE LENGTH: Hamstrings: Right  50 deg; Left 45 deg Thomas test: Right NT deg; Left NT deg   POSTURE: rounded shoulders and forward head   PALPATION: TTP R low back paraspinals and QL with increase muscle tension   LOWER EXTREMITY MMT: Decreased core strength MMT ROM Right eval Left eval  Hip flexion 3+ 3+  Hip extension 3 3  Hip abduction 3 3  Hip adduction      Hip internal rotation      Hip external rotation 3+ 3+  Knee flexion 4+ 4+  Knee extension 4+ 4+  Ankle dorsiflexion 4 4  Ankle plantarflexion      Ankle inversion      Ankle eversion       (Blank rows = not tested)   LOWER EXTREMITY ROM: Grossly WNLs ROM Right eval Left eval  Hip flexion      Hip extension      Hip abduction      Hip adduction      Hip internal rotation      Hip external rotation      Knee flexion      Knee extension      Ankle dorsiflexion      Ankle plantarflexion      Ankle inversion      Ankle eversion       (Blank rows = not tested)   Lumbar ROM: NT due to pt reported mobility restrictions   LOWER EXTREMITY SPECIAL TESTS:  NT   FUNCTIONAL TESTS:  5 times sit to stand: TBA 2 minute walk test: TBA SLS: TBA   GAIT: Distance walked: 294ft Assistive device utilized: None Level of assistance: Complete Independence Comments: Decreased pace     TODAY'S TREATMENT:  [facility] Adult PT Treatment:                                                DATE: 08/17/22 Therapeutic Exercise: Nustep L4 6 mins UE/LE SKTC x2 20" each Bridge min range x5 PPT x15 3sec Trunk flexion forward and laterally c swiss ball Sit to stand from raise bari-mat x5 Updated  HEP Modalities: MH to the low back x10 mins  [facility] Adult PT Treatment:                                                DATE: 08/10/22 Therapeutic Exercise: Developed, instructed in, and pt completed therex as noted in HEP    PATIENT EDUCATION:  Education details: Eval findings, POC, HEP, self care  Person educated: Patient Education method: Explanation, Demonstration, Tactile cues, Verbal cues, and Handouts Education comprehension: verbalized understanding, returned demonstration, verbal cues required, and tactile cues required   HOME EXERCISE PROGRAM: Access Code: [email or web address] URL: [email or web address] Date: 08/17/2022 Prepared by: Gar Ponto  Exercises - Standing Shoulder Row with Anchored Resistance  - 1-2 x daily - 7 x weekly - 2-3 sets - 10 reps - Shoulder extension with resistance - Neutral  - 1-2 x daily - 7 x weekly - 2-3 sets - 10 reps - Mini Squat with Counter Support  - 1-2 x daily - 7 x weekly - 2-3 sets - 10 reps - Supine Posterior Pelvic Tilt  - 1-2 x daily - 7 x weekly - 2-3 sets - 10 reps - 3 hold - Supine Bridge  - 1-2 x daily - 7 x weekly - 1 sets - 5-10 reps - 3 hold - Hooklying Single Knee to Chest  - 1-2 x daily - 7 x weekly - 1 sets - 3 reps - 20 hold - Seated Flexion Stretch with Swiss Ball  - 1-2 x daily - 7 x weekly - 1 sets - 5-10 reps - 5-20 hold   ASSESSMENT:   CLINICAL IMPRESSION: PT was completed for gentle lumbopelvic flexibility and strengthening. Extra time was needed fro pt to feel confident with the prescribed therex. Pt completed without adverse effects. Pt 's HEP was updated and she returned proper demonstration. Pt will continue to benefit from skilled PT to address impairments for improved function of her low back with minimized pain.  OBJECTIVE IMPAIRMENTS: decreased activity tolerance, decreased balance,  decreased mobility, difficulty walking, decreased ROM, increased muscle spasms, impaired flexibility, and pain.    ACTIVITY LIMITATIONS: carrying, lifting, bending, sitting, standing, squatting, sleeping, bathing, toileting, dressing, reach over head, locomotion level, and caring for others   PARTICIPATION LIMITATIONS: meal prep, cleaning, laundry, driving, community activity, and occupation   PERSONAL FACTORS: Fitness, Past/current experiences, Profession, and Time since onset of injury/illness/exacerbation are also affecting patient's functional outcome.    REHAB POTENTIAL: Good   CLINICAL DECISION MAKING: Evolving/moderate complexity   EVALUATION COMPLEXITY: Moderate     GOALS:   SHORT TERM GOALS: Target date: 09/02/22 Pt will be Ind in an initial HEP  Baseline: initiated Goal status: INITIAL   LONG TERM GOALS: Target date: 10/07/22   Pt will be Ind in a final HEP to maintain achieved LOF  Baseline: 10/07/22 Goal status: INITIAL   2.  Improve 5xSTS by MCID of 5" and 2MWT by MCID of 27ft as indication of improved functional mobility  Baseline: TBA Goal status: INITIAL   3.  Improve SLS to greater than 15" as demonstration of improved stability/balance  Baseline: TBA Goal status: INITIAL   4.  Improve pt's core strength as demonstrated by maintaining a bridge for 60" and a plank from her knees for 30" Baseline: TBA Goal status: INITIAL   5.  Improve pt's bilat hip strength for 4+/5 for improved stability and function Baseline: see flow sheets Goal status:  INITIAL   6.  Pt will report a decrease in LBP and L LE pain to 5/10 or for improved function and QOL Baseline: 4-9/10 Goal status: INITIAL     PLAN:   PT FREQUENCY: 2x/week   PT DURATION: other: 7 weeks   PLANNED INTERVENTIONS: Therapeutic exercises, Therapeutic activity, Balance training, Gait training, Patient/Family education, Self Care, Joint mobilization, Stair training, Aquatic Therapy, Dry Needling,  Electrical stimulation, Spinal mobilization, Cryotherapy, Moist heat, Taping, Ionotophoresis 4mg /ml Dexamethasone, Manual therapy, and Re-evaluation   PLAN FOR NEXT SESSION: Review FOTO; assess response to HEP; progress therex as indicated; use of modalities, manual therapy; and TPDN as indicated. Will contact Dr. Kathyrn Sheriff re: most recent precautions.  Hunter Pinkard MS, PT 08/17/22 5:05 PM

## 2022-08-22 ENCOUNTER — Encounter: Payer: Self-pay | Admitting: Physical Therapy

## 2022-08-22 ENCOUNTER — Ambulatory Visit: Payer: No Typology Code available for payment source | Admitting: Physical Therapy

## 2022-08-22 DIAGNOSIS — M6281 Muscle weakness (generalized): Secondary | ICD-10-CM

## 2022-08-22 DIAGNOSIS — M5442 Lumbago with sciatica, left side: Secondary | ICD-10-CM | POA: Diagnosis not present

## 2022-08-22 DIAGNOSIS — R208 Other disturbances of skin sensation: Secondary | ICD-10-CM

## 2022-08-22 DIAGNOSIS — R262 Difficulty in walking, not elsewhere classified: Secondary | ICD-10-CM

## 2022-08-22 NOTE — Therapy (Signed)
OUTPATIENT PHYSICAL THERAPY TREATMENT NOTE   Patient Name: Abigail Reynolds MRN: [id number] DOB:11/18/1975, 47 y.o., female Today's Date: 08/22/2022  PCP: [facility]   REFERRING PROVIDER: Consuella Lose, MD   END OF SESSION:   PT End of Session - 08/22/22 1455     Visit Number 3    Number of Visits 15    Date for PT Re-Evaluation 10/07/22    Authorization Type [facility]    Authorization Time Period 02/16/22 - 08/29/2022    Authorization - Visit Number 3    PT Start Time [id number]    PT Stop Time 0328    PT Time Calculation (min) 43 min             Past Medical History:  Diagnosis Date   Arthritis    Depression    GERD (gastroesophageal reflux disease)    Headache    PTSD (post-traumatic stress disorder)    Seizures ([place])    FOCAL SEIZURES  NO IN 2 YEARS "last sezuire over 5 years ago" per pt   Past Surgical History:  Procedure Laterality Date   BACK SURGERY     L4 AND L5 2014   CERVICAL SPINE SURGERY     C 4 AND C5   COLONOSCOPY     DILATION AND CURETTAGE OF UTERUS     DILITATION & CURRETTAGE/HYSTROSCOPY WITH HYDROTHERMAL ABLATION N/A 01/01/2014   Procedure: DILATATION & CURETTAGE/HYSTEROSCOPY WITH attempted HYDROTHERMAL ABLATION;  Surgeon: Frederico Hamman, MD;  Location: [facility];  Service: Gynecology;  Laterality: N/A;   LASIK     TONSILLECTOMY     TUBAL LIGATION     Patient Active Problem List   Diagnosis Date Noted   Spondylolisthesis of lumbar region 06/03/2022   Ankle instability 06/28/2021   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 Hx of chronic LBP    SUBJECTIVE:    SUBJECTIVE STATEMENT: Still doing the exercises every other day.    PERTINENT HISTORY: Neck pain, Cervical surgery, PTSD, HAs   PAIN:  Are you having pain? Yes: NPRS scale: 4/10 Pain location: low back Pain description: pressure Aggravating factors: prolonged sitting (20 min), standing and walking (30 mins standing and walking) Relieving factors: changing positions, lying down Pain range on 4-9/10   PRECAUTIONS: Back 5# lifting restriction; and No BLT as per pt. Spoke with Dr. Cleotilde Neer office and the pt's lifting and movement restrictions have been lifted.   WEIGHT BEARING RESTRICTIONS: No   FALLS:  Has patient fallen in last 6 months? 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   Fluoroscopy time: 54 seconds.   Cumulative air Kerma 38.13 mGy   Images: 2   EXAM: LUMBAR SPINE -  2-3 VIEW   COMPARISON:  None Available.   FINDINGS: Imaging was provided during placement of pedicle rods and screws at L4, L5, and S1. The right-sided L4 screw distal tip appears to extend just above the superior endplate. Hardware is otherwise in good position. Disc spacer devices are noted.   IMPRESSION: The right L4 screw distal tip appears to extend just above the superior endplate. Hardware is otherwise in good position.     On: 06/03/2022 12:38      PATIENT SURVEYS:  FOTO: Perceived function   47%, predicted   54%    COGNITION: Overall cognitive status: Within  functional limits for tasks assessed                         SENSATION: WFL   EDEMA:             None observed    MUSCLE LENGTH: Hamstrings: Right  50 deg; Left 45 deg Thomas test: Right NT deg; Left NT deg   POSTURE: rounded shoulders and forward head   PALPATION: TTP R low back paraspinals and QL with increase muscle tension   LOWER EXTREMITY MMT: Decreased core strength MMT ROM Right eval Left eval  Hip flexion 3+ 3+  Hip extension 3 3  Hip abduction 3 3  Hip adduction      Hip internal rotation      Hip external rotation 3+ 3+  Knee flexion 4+ 4+  Knee extension 4+ 4+  Ankle dorsiflexion 4 4  Ankle plantarflexion      Ankle inversion      Ankle eversion       (Blank rows = not tested)   LOWER EXTREMITY ROM: Grossly WNLs ROM Right eval Left eval  Hip flexion      Hip extension      Hip abduction      Hip adduction      Hip internal rotation      Hip external rotation      Knee flexion      Knee extension      Ankle dorsiflexion      Ankle plantarflexion      Ankle inversion      Ankle eversion       (Blank rows = not tested)   Lumbar ROM: NT due to pt reported mobility restrictions   LOWER EXTREMITY SPECIAL TESTS:  NT   FUNCTIONAL TESTS:  5 times sit to stand: 08/22/22: 51 seconds with UE standard mat 2 minute walk test: 323 feet 08/22/22 SLS: TBA   GAIT: Distance walked: 276ft Assistive device utilized: None Level of assistance: Complete Independence Comments: Decreased pace     TODAY'S TREATMENT:  [facility] Adult PT Treatment:                                                DATE: 08/22/22 Therapeutic Exercise: Nustep L3 UE/LE x 5 minutes  PPT PPT to bridge SKTC LTR with cues to keep comfortable Seated hamstring stretch Therapeutic Activity: 2 minute walk test: 323 feet Seated hip hinge x 5  5 times sit to stand: 08/22/22: 51 seconds with UE (standard mat) Modalities: Declined     [facility] Adult PT Treatment:  DATE: 08/17/22 Therapeutic Exercise: Nustep L4 6 mins UE/LE SKTC x2 20" each Bridge min range x5 PPT x15 3sec Trunk flexion forward and laterally c swiss ball Sit to stand from raise bari-mat x5 Updated HEP Modalities: MH to the low back x10 mins                                                                                                                              [facility] Adult PT Treatment:                                                DATE: 08/10/22 Therapeutic Exercise: Developed, instructed in, and pt completed therex as noted in HEP    PATIENT EDUCATION:  Education details: Eval findings, POC, HEP, self care  Person educated: Patient Education method: Explanation, Demonstration, Tactile cues, Verbal cues, and Handouts Education comprehension: verbalized understanding, returned demonstration, verbal cues required, and tactile cues required   HOME EXERCISE PROGRAM: Access Code: [email or web address] URL: [email or web address] Date: 08/17/2022 Prepared by: Gar Ponto  Exercises - Standing Shoulder Row with Anchored Resistance  - 1-2 x daily - 7 x weekly - 2-3 sets - 10 reps - Shoulder extension with resistance - Neutral  - 1-2 x daily - 7 x weekly - 2-3 sets - 10 reps - Mini Squat with Counter Support  - 1-2 x daily - 7 x weekly - 2-3 sets - 10 reps - Supine Posterior Pelvic Tilt  - 1-2 x daily - 7 x weekly - 2-3 sets - 10 reps - 3 hold - Supine Bridge  - 1-2 x daily - 7 x weekly - 1 sets - 5-10 reps - 3 hold - Hooklying Single Knee to Chest  - 1-2 x daily - 7 x weekly - 1 sets - 3 reps - 20 hold - Seated Flexion Stretch with Swiss Ball  - 1-2 x daily - 7 x weekly - 1 sets - 5-10 reps - 5-20 hold Added 08/22/22:  - Supine Lower Trunk Rotation  - 1 x daily - 7 x weekly - 1 sets - 5-10 reps - 10 hold    ASSESSMENT:   CLINICAL IMPRESSION: PT was completed for gentle lumbopelvic flexibility and strengthening. Captured 2 MWT and 5 x STS. Pt receptive to  cues for seated hip hinge to allow for ease with STS. Reviewed mat based HEP and began small ROM LTR. She declined modalities at end of session.  Pt will continue to benefit from skilled PT to address impairments for improved function of her low back with minimized pain.   OBJECTIVE IMPAIRMENTS: decreased activity tolerance, decreased balance, decreased mobility, difficulty walking, decreased ROM, increased muscle spasms, impaired flexibility, and pain.    ACTIVITY LIMITATIONS: carrying, lifting, bending, sitting, standing, squatting, sleeping, bathing, toileting, dressing, reach over head, locomotion level, and caring  for others   PARTICIPATION LIMITATIONS: meal prep, cleaning, laundry, driving, community activity, and occupation   PERSONAL FACTORS: Fitness, Past/current experiences, Profession, and Time since onset of injury/illness/exacerbation are also affecting patient's functional outcome.    REHAB POTENTIAL: Good   CLINICAL DECISION MAKING: Evolving/moderate complexity   EVALUATION COMPLEXITY: Moderate     GOALS:   SHORT TERM GOALS: Target date: 09/02/22 Pt will be Ind in an initial HEP  Baseline: initiated Goal status: INITIAL   LONG TERM GOALS: Target date: 10/07/22   Pt will be Ind in a final HEP to maintain achieved LOF  Baseline: 10/07/22 Goal status: INITIAL   2.  Improve 5xSTS by MCID of 5" and 2MWT by MCID of 38ft as indication of improved functional mobility  Baseline: see functional tests Goal status: INITIAL   3.  Improve SLS to greater than 15" as demonstration of improved stability/balance  Baseline: TBA Goal status: INITIAL   4.  Improve pt's core strength as demonstrated by maintaining a bridge for 60" and a plank from her knees for 30" Baseline: TBA Goal status: INITIAL   5.  Improve pt's bilat hip strength for 4+/5 for improved stability and function Baseline: see flow sheets Goal status: INITIAL   6.  Pt will report a decrease in LBP and L LE pain  to 5/10 or for improved function and QOL Baseline: 4-9/10 Goal status: INITIAL     PLAN:   PT FREQUENCY: 2x/week   PT DURATION: other: 7 weeks   PLANNED INTERVENTIONS: Therapeutic exercises, Therapeutic activity, Balance training, Gait training, Patient/Family education, Self Care, Joint mobilization, Stair training, Aquatic Therapy, Dry Needling, Electrical stimulation, Spinal mobilization, Cryotherapy, Moist heat, Taping, Ionotophoresis 4mg /ml Dexamethasone, Manual therapy, and Re-evaluation   PLAN FOR NEXT SESSION: Review FOTO; assess response to HEP; progress therex as indicated; use of modalities, manual therapy; and TPDN as indicated. Will contact Dr. Kathyrn Sheriff re: most recent precautions.  Hessie Diener, PTA 08/22/22 3:34 PM Phone: [phone] Fax: [phone]

## 2022-08-23 NOTE — Therapy (Signed)
OUTPATIENT PHYSICAL THERAPY TREATMENT NOTE   Patient Name: Abigail Reynolds MRN: [id number] DOB:1976/04/12, 47 y.o., female Today's Date: 08/24/2022  PCP: [facility]   REFERRING PROVIDER: Consuella Lose, MD   END OF SESSION:   PT End of Session - 08/24/22 1558     Visit Number 4    Number of Visits 15    Date for PT Re-Evaluation 10/07/22    Authorization Type [facility]    Authorization Time Period 02/16/22 - 08/29/2022    Authorization - Visit Number 4    Authorization - Number of Visits 15    Progress Note Due on Visit 10    PT Start Time [id number]    PT Stop Time 1636    PT Time Calculation (min) 42 min    Activity Tolerance Patient tolerated treatment well    Behavior During Therapy WFL for tasks assessed/performed             Past Medical History:  Diagnosis Date   Arthritis    Depression    GERD (gastroesophageal reflux disease)    Headache    PTSD (post-traumatic stress disorder)    Seizures ([place])    FOCAL SEIZURES  NO IN 2 YEARS "last sezuire over 5 years ago" per pt   Past Surgical History:  Procedure Laterality Date   BACK SURGERY     L4 AND L5 2014   CERVICAL SPINE SURGERY     C 4 AND C5   COLONOSCOPY     DILATION AND CURETTAGE OF UTERUS     DILITATION & CURRETTAGE/HYSTROSCOPY WITH HYDROTHERMAL ABLATION N/A 01/01/2014   Procedure: DILATATION & CURETTAGE/HYSTEROSCOPY WITH attempted HYDROTHERMAL ABLATION;  Surgeon: Frederico Hamman, MD;  Location: [facility];  Service: Gynecology;  Laterality: N/A;   LASIK     TONSILLECTOMY     TUBAL LIGATION     Patient Active Problem List   Diagnosis Date Noted   Spondylolisthesis of lumbar region 06/03/2022   Ankle instability 06/28/2021   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 Hx of chronic LBP   SUBJECTIVE:    SUBJECTIVE STATEMENT: Still doing the exercises every other day.    PERTINENT HISTORY: Neck pain, Cervical surgery, PTSD, HAs   PAIN:  Are you having pain? Yes: NPRS scale: 4/10 which is a OK pain level for her Pain location: low back Pain description: pressure  Aggravating factors: prolonged sitting (20 min), standing and walking (30 mins standing and walking) Relieving factors: changing positions, lying down Pain range on 4-9/10   PRECAUTIONS: Back 5# lifting restriction; and No BLT as per pt. Spoke with Dr. Cleotilde Neer office and the pt's lifting and movement restrictions have been lifted.   WEIGHT BEARING RESTRICTIONS: No   FALLS:  Has patient fallen in last 6 months? 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   Fluoroscopy time: 54 seconds.   Cumulative air Kerma 38.13 mGy   Images: 2   EXAM: LUMBAR SPINE - 2-3 VIEW   COMPARISON:  None Available.   FINDINGS: Imaging was provided during placement of pedicle rods and screws at L4, L5, and S1. The right-sided L4 screw distal tip appears to extend just above the superior endplate. Hardware is otherwise in good position. Disc spacer devices are noted.    IMPRESSION: The right L4 screw distal tip appears to extend just above the superior endplate. Hardware is otherwise in good position.     On: 06/03/2022 12:38      PATIENT SURVEYS:  FOTO: Perceived function   47%, predicted   54%    COGNITION: Overall cognitive status: Within functional limits for tasks assessed                         SENSATION: WFL   EDEMA:             None observed    MUSCLE LENGTH: Hamstrings: Right  50 deg; Left 45 deg Thomas test: Right NT deg; Left NT deg   POSTURE: rounded shoulders and forward head   PALPATION: TTP R low back paraspinals and QL with increase muscle tension   LOWER EXTREMITY MMT: Decreased core strength MMT ROM Right eval Left eval  Hip flexion 3+ 3+  Hip extension 3 3  Hip abduction 3 3  Hip adduction      Hip internal rotation      Hip external rotation 3+ 3+  Knee flexion 4+ 4+  Knee extension 4+ 4+  Ankle dorsiflexion 4 4  Ankle plantarflexion      Ankle inversion      Ankle eversion       (Blank rows = not tested)   LOWER EXTREMITY ROM: Grossly WNLs ROM Right eval Left eval  Hip flexion      Hip extension      Hip abduction      Hip adduction      Hip internal rotation      Hip external rotation      Knee flexion      Knee extension      Ankle dorsiflexion      Ankle plantarflexion      Ankle inversion      Ankle eversion       (Blank rows = not tested)   Lumbar ROM: NT due to pt reported mobility restrictions   LOWER EXTREMITY SPECIAL TESTS:  NT   FUNCTIONAL TESTS:  5 times sit to stand: 08/22/22: 51 seconds with UE standard mat 2 minute walk test: 323 feet 08/22/22 SLS: TBA   GAIT: Distance walked: 275ft Assistive device utilized: None Level of assistance: Complete Independence Comments: Decreased pace     TODAY'S TREATMENT:  [facility] Adult PT Treatment:                                                DATE: 08/24/22 Therapeutic Exercise: Nustep L4 6 mins UE/LE SKTC x2 20" each LTR with  cues to keep comfortable PPT x15 3sec Bridge c PPT x10  Mod Thomas stretch c belt x2 30" each Hip flexor stretch in standing x1 30" L Shoulder row x15 GTB Shoulder ext x 15 GTB Updated HEP   [facility] Adult PT Treatment:                                                DATE: 08/22/22 Therapeutic Exercise: Nustep L3 UE/LE x 5 minutes  PPT PPT to bridge SKTC LTR with cues to keep comfortable Seated hamstring stretch Therapeutic Activity: 2 minute walk test: 323 feet Seated hip hinge x 5  5 times sit to stand: 08/22/22: 51 seconds with UE (standard mat) Modalities: Declined     [facility] Adult PT Treatment:                                                DATE: 08/17/22 Therapeutic Exercise: Nustep L4 6 mins UE/LE SKTC x2 20" each Bridge min range x5 PPT x15 3sec Trunk flexion forward and laterally c swiss ball Sit to stand from raise bari-mat x5 Updated HEP Modalities: MH to the low back x10 mins                                                                                                                              [facility] Adult PT Treatment:                                                DATE: 08/10/22 Therapeutic Exercise: Developed, instructed in, and pt completed therex as noted in HEP    PATIENT EDUCATION:  Education details: Eval findings, POC, HEP, self care  Person educated: Patient Education method: Explanation, Demonstration, Tactile cues, Verbal cues, and Handouts Education comprehension: verbalized understanding, returned demonstration, verbal cues required, and tactile cues required   HOME EXERCISE PROGRAM: Access Code: [email or web address] URL: [email or web address] Date: 08/24/2022 Prepared by: Gar Ponto  Exercises - Standing Shoulder Row with Anchored Resistance  - 1-2 x daily - 7 x weekly - 2-3 sets - 10 reps - Shoulder extension with resistance - Neutral  - 1-2 x daily - 7 x weekly - 2-3 sets - 10 reps - Mini Squat with Counter Support  - 1-2 x daily - 7 x  weekly - 2-3 sets - 10 reps - Supine Posterior Pelvic Tilt  - 1-2 x daily - 7 x weekly - 2-3 sets - 10 reps - 3 hold - Supine Bridge  - 1-2 x daily - 7 x weekly - 1 sets - 5-10 reps - 3 hold - Hooklying Single Knee to Chest  -  1-2 x daily - 7 x weekly - 1 sets - 3 reps - 20 hold - Seated Flexion Stretch with Swiss Ball  - 1-2 x daily - 7 x weekly - 1 sets - 5-10 reps - 5-20 hold - Supine Lower Trunk Rotation  - 1 x daily - 7 x weekly - 1 sets - 5-10 reps - 10 hold - Modified Thomas Stretch  - 1 x daily - 7 x weekly - 1 sets - 2-3 reps - 30 hold - Standing Hip Flexor Stretch  - 1 x daily - 7 x weekly - 1 sets - 2-3 reps - 30 hold    ASSESSMENT:   CLINICAL IMPRESSION: PT was completed for lumbopelvic flexibility and strengthening. With [facility], pt reported ant/lateral hip pain. Therex for hip flexor stretches were completed and pt reported feeling a good stretch in the area of her L ant/lat hip. Pt's functional mobility appears improved noted with better quality/ease of movement for sit to/from stand and sit to/from supine. Pt will continue to benefit from skilled PT to address impairments for improved function of her low back with minimized pain. Assess STG th next PT session.  OBJECTIVE IMPAIRMENTS: decreased activity tolerance, decreased balance, decreased mobility, difficulty walking, decreased ROM, increased muscle spasms, impaired flexibility, and pain.    ACTIVITY LIMITATIONS: carrying, lifting, bending, sitting, standing, squatting, sleeping, bathing, toileting, dressing, reach over head, locomotion level, and caring for others   PARTICIPATION LIMITATIONS: meal prep, cleaning, laundry, driving, community activity, and occupation   PERSONAL FACTORS: Fitness, Past/current experiences, Profession, and Time since onset of injury/illness/exacerbation are also affecting patient's functional outcome.    REHAB POTENTIAL: Good   CLINICAL DECISION MAKING: Evolving/moderate complexity   EVALUATION  COMPLEXITY: Moderate     GOALS:   SHORT TERM GOALS: Target date: 09/02/22 Pt will be Ind in an initial HEP  Baseline: initiated Goal status: INITIAL   LONG TERM GOALS: Target date: 10/07/22   Pt will be Ind in a final HEP to maintain achieved LOF  Baseline: 10/07/22 Goal status: INITIAL   2.  Improve 5xSTS by MCID of 5" and 2MWT by MCID of 70ft as indication of improved functional mobility  Baseline: see functional tests Goal status: INITIAL   3.  Improve SLS to greater than 15" as demonstration of improved stability/balance  Baseline: TBA Goal status: INITIAL   4.  Improve pt's core strength as demonstrated by maintaining a bridge for 60" and a plank from her knees for 30" Baseline: TBA Goal status: INITIAL   5.  Improve pt's bilat hip strength for 4+/5 for improved stability and function Baseline: see flow sheets Goal status: INITIAL   6.  Pt will report a decrease in LBP and L LE pain to 5/10 or for improved function and QOL Baseline: 4-9/10 Goal status: INITIAL     PLAN:   PT FREQUENCY: 2x/week   PT DURATION: other: 7 weeks   PLANNED INTERVENTIONS: Therapeutic exercises, Therapeutic activity, Balance training, Gait training, Patient/Family education, Self Care, Joint mobilization, Stair training, Aquatic Therapy, Dry Needling, Electrical stimulation, Spinal mobilization, Cryotherapy, Moist heat, Taping, Ionotophoresis 4mg /ml Dexamethasone, Manual therapy, and Re-evaluation   PLAN FOR NEXT SESSION: Review FOTO; assess response to HEP; progress therex as indicated; use of modalities, manual therapy; and TPDN as indicated.  Mike Berntsen MS, PT 08/24/22 5:10 PM

## 2022-08-24 ENCOUNTER — Ambulatory Visit: Payer: No Typology Code available for payment source

## 2022-08-24 DIAGNOSIS — R262 Difficulty in walking, not elsewhere classified: Secondary | ICD-10-CM

## 2022-08-24 DIAGNOSIS — G8929 Other chronic pain: Secondary | ICD-10-CM

## 2022-08-24 DIAGNOSIS — M5442 Lumbago with sciatica, left side: Secondary | ICD-10-CM | POA: Diagnosis not present

## 2022-08-24 DIAGNOSIS — R208 Other disturbances of skin sensation: Secondary | ICD-10-CM

## 2022-08-24 DIAGNOSIS — M6281 Muscle weakness (generalized): Secondary | ICD-10-CM

## 2022-08-30 ENCOUNTER — Ambulatory Visit: Payer: No Typology Code available for payment source

## 2022-09-20 ENCOUNTER — Ambulatory Visit: Payer: No Typology Code available for payment source | Attending: Neurosurgery

## 2022-09-20 DIAGNOSIS — M5459 Other low back pain: Secondary | ICD-10-CM | POA: Insufficient documentation

## 2022-09-20 DIAGNOSIS — R208 Other disturbances of skin sensation: Secondary | ICD-10-CM | POA: Diagnosis present

## 2022-09-20 DIAGNOSIS — R262 Difficulty in walking, not elsewhere classified: Secondary | ICD-10-CM | POA: Insufficient documentation

## 2022-09-20 DIAGNOSIS — M6281 Muscle weakness (generalized): Secondary | ICD-10-CM | POA: Diagnosis present

## 2022-09-20 DIAGNOSIS — M5442 Lumbago with sciatica, left side: Secondary | ICD-10-CM | POA: Insufficient documentation

## 2022-09-20 DIAGNOSIS — G8929 Other chronic pain: Secondary | ICD-10-CM | POA: Diagnosis present
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
## 2022-09-21 NOTE — Therapy (Signed)
OUTPATIENT PHYSICAL THERAPY TREATMENT NOTE   Patient Name: Abigail Reynolds MRN: [id number] DOB:11-26-1975, 47 y.o., female Today's Date: 09/22/2022  [facility]: [facility]   REFERRING PROVIDER: Lisbeth Renshaw, MD   END OF SESSION:   PT End of Session - 09/22/22 1514     Visit Number 6    Number of Visits 19    Date for PT Re-Evaluation 10/07/22    Authorization Type [place]    Authorization Time Period 08/31/2022 - 03/04/2023    Authorization - Visit Number 6    Authorization - Number of Visits 19    Progress Note Due on Visit 10    PT Start Time 1504    PT Stop Time 1545    PT Time Calculation (min) 41 min    Activity Tolerance Patient tolerated treatment well    Behavior During Therapy WFL for tasks assessed/performed               Past Medical History:  Diagnosis Date   Arthritis    Depression    GERD (gastroesophageal reflux disease)    Headache    PTSD (post-traumatic stress disorder)    Seizures (HCC)    FOCAL SEIZURES  NO IN 2 YEARS "last sezuire over 5 years ago" per pt   Past Surgical History:  Procedure Laterality Date   BACK SURGERY     L4 AND L5 2014   CERVICAL SPINE SURGERY     C 4 AND C5   COLONOSCOPY     DILATION AND CURETTAGE OF UTERUS     DILITATION & CURRETTAGE/HYSTROSCOPY WITH HYDROTHERMAL ABLATION N/A 01/01/2014   Procedure: DILATATION & CURETTAGE/HYSTEROSCOPY WITH attempted HYDROTHERMAL ABLATION;  Surgeon: Kathreen Cosier, MD;  Location: [facility];  Service: Gynecology;  Laterality: N/A;   LASIK     TONSILLECTOMY     TUBAL LIGATION     Patient Active Problem List   Diagnosis Date Noted   Spondylolisthesis of lumbar region 06/03/2022   Ankle instability 06/28/2021   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 Hx of chronic LBP   SUBJECTIVE:    SUBJECTIVE STATEMENT: Pt reports yesterday caring for a 7 week baby which aggravated her low back, but today the pain is at a lower level.   PERTINENT HISTORY: Neck pain, Cervical surgery, PTSD, HAs   PAIN:  Are you having pain? Yes: NPRS scale:3/10  Pain location: low back Pain description: pressure  Aggravating factors: prolonged sitting (20 min), standing and walking (30 mins standing and walking) Relieving factors: changing positions, lying down Pain range on 4-9/10   PRECAUTIONS: Back 5# lifting restriction; and No BLT as per pt. Spoke with Dr. Val Riles office and the pt's lifting and movement restrictions have been lifted.   WEIGHT BEARING RESTRICTIONS: No   FALLS:  Has patient fallen in last 6 months? 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   Fluoroscopy time: 54 seconds.   Cumulative air Kerma 38.13 mGy   Images: 2   EXAM: LUMBAR SPINE - 2-3 VIEW   COMPARISON:  None Available.   FINDINGS: Imaging was provided during placement of pedicle rods and screws at L4, L5, and S1. The right-sided L4 screw distal tip appears to extend just above the superior endplate. Hardware is otherwise in good position. Disc spacer devices are  noted.   IMPRESSION: The right L4 screw distal tip appears to extend just above the superior endplate. Hardware is otherwise in good position.     On: 06/03/2022 12:38      PATIENT SURVEYS:  FOTO: Perceived function   47%, predicted   54%    COGNITION: Overall cognitive status: Within functional limits for tasks assessed                         SENSATION: WFL   EDEMA:             None observed    MUSCLE LENGTH: Hamstrings: Right  50 deg; Left 45 deg Thomas test: Right NT deg; Left NT deg   POSTURE: rounded shoulders and forward head   PALPATION: TTP R low back paraspinals and QL with increase muscle tension   LOWER EXTREMITY MMT: Decreased core strength MMT ROM Right eval Left eval  Hip flexion 3+ 3+  Hip extension 3 3  Hip abduction 3 3  Hip adduction      Hip internal rotation      Hip external rotation 3+ 3+  Knee flexion 4+ 4+  Knee extension 4+ 4+  Ankle dorsiflexion 4 4  Ankle plantarflexion      Ankle inversion      Ankle eversion       (Blank rows = not tested)   LOWER EXTREMITY ROM: Grossly WNLs ROM Right eval Left eval  Hip flexion      Hip extension      Hip abduction      Hip adduction      Hip internal rotation      Hip external rotation      Knee flexion      Knee extension      Ankle dorsiflexion      Ankle plantarflexion      Ankle inversion      Ankle eversion       (Blank rows = not tested)   Lumbar ROM: NT due to pt reported mobility restrictions   LOWER EXTREMITY SPECIAL TESTS:  NT   FUNCTIONAL TESTS:  5 times sit to stand: 08/22/22: 51 seconds with UE standard mat 2 minute walk test: 323 feet 08/22/22 SLS: TBA   GAIT: Distance walked: 261ft Assistive device utilized: None Level of assistance: Complete Independence Comments: Decreased pace     TODAY'S TREATMENT:  [facility] Adult PT Treatment:                                                DATE: 09/22/22 Therapeutic Exercise: Nustep L5 6 mins UE/LE SKTC x1 20"  each DKTC x2 20 LTR with cues to  keep comfortable x5 90/90 AB bracing x5 10" Bridge c PPT x10 STS x10 Paloff press double RTB x10 each SLS each  [facility] Adult PT Treatment:                                                DATE: 09/20/22 Therapeutic Exercise: Nustep L5 6 mins UE/LE SKTC x2 20" each DKTC x2 20 LTR with cues to keep comfortable PPT x15 3sec Bridge c PPT x10 STS x10 Hip flexor stretch in standing x1 30" L Shoulder row x15 GTB Shoulder ext x 15 GTB  [facility] Adult PT Treatment:                                                DATE: 08/24/22 Therapeutic Exercise: Nustep L4 6 mins UE/LE SKTC x2 20" each LTR with cues to keep comfortable PPT x15 3sec Bridge c PPT x10 Mod Thomas stretch c belt x2 30" each Hip flexor stretch in standing x1 30" L Shoulder row x15 GTB Shoulder ext x 15 GTB Updated HEP  [facility] Adult PT Treatment:                                                DATE: 08/22/22 Therapeutic Exercise: Nustep L3 UE/LE x 5 minutes  PPT PPT to bridge SKTC LTR with cues to keep comfortable Seated hamstring stretch Therapeutic Activity: 2 minute walk test: 323 feet Seated hip hinge x 5  5 times sit to stand: 08/22/22: 51 seconds with UE (standard mat) Modalities: Declined    PATIENT EDUCATION:  Education details: Eval findings, POC, HEP, self care  Person educated: Patient Education method: Explanation, Demonstration, Tactile cues, Verbal cues, and Handouts Education comprehension: verbalized understanding, returned demonstration, verbal cues required, and tactile cues required   HOME EXERCISE PROGRAM: Access Code: [email or web address] URL: [email or web address] Date: 08/24/2022 Prepared by: Joellyn Rued  Exercises - Standing Shoulder Row with Anchored Resistance  - 1-2 x daily - 7 x weekly - 2-3 sets - 10 reps - Shoulder extension with resistance - Neutral  - 1-2 x daily - 7 x weekly - 2-3 sets - 10 reps - Mini Squat with Counter Support  - 1-2 x daily - 7  x weekly - 2-3 sets - 10 reps - Supine Posterior Pelvic Tilt  - 1-2 x daily - 7 x weekly - 2-3 sets - 10 reps - 3 hold - Supine Bridge  - 1-2 x daily - 7 x weekly - 1 sets - 5-10 reps - 3 hold - Hooklying Single Knee to Chest  - 1-2 x daily - 7 x weekly - 1 sets - 3 reps - 20 hold - Seated Flexion Stretch with Swiss Ball  - 1-2 x daily - 7 x weekly - 1 sets - 5-10 reps - 5-20 hold - Supine Lower Trunk Rotation  - 1 x daily - 7 x weekly - 1 sets - 5-10 reps - 10 hold - Modified Thomas Stretch  - 1 x daily - 7 x weekly - 1 sets - 2-3 reps - 30 hold - Standing Hip Flexor Stretch  -  1 x daily - 7 x weekly - 1 sets - 2-3 reps - 30 hold    ASSESSMENT:   CLINICAL IMPRESSION: PT was completed for lumbopelvic flexibility and strengthening. Completed single leg standing to assess balance and lumbopelvic stability. Pt demonstrated good balance and stability maintaining SL balance for greater than 20" for each LE. Pt tolerated prescribed therex without adverse effects. Pt will continue to benefit from skilled PT to address impairments for improved function with less pain.  OBJECTIVE IMPAIRMENTS: decreased activity tolerance, decreased balance, decreased mobility, difficulty walking, decreased ROM, increased muscle spasms, impaired flexibility, and pain.    ACTIVITY LIMITATIONS: carrying, lifting, bending, sitting, standing, squatting, sleeping, bathing, toileting, dressing, reach over head, locomotion level, and caring for others   PARTICIPATION LIMITATIONS: meal prep, cleaning, laundry, driving, community activity, and occupation   PERSONAL FACTORS: Fitness, Past/current experiences, Profession, and Time since onset of injury/illness/exacerbation are also affecting patient's functional outcome.    REHAB POTENTIAL: Good   CLINICAL DECISION MAKING: Evolving/moderate complexity   EVALUATION COMPLEXITY: Moderate     GOALS:   SHORT TERM GOALS: Target date: 09/02/22 Pt will be Ind in an initial HEP   Baseline: initiated Goal status: MET 09/20/22   LONG TERM GOALS: Target date: 10/07/22   Pt will be Ind in a final HEP to maintain achieved LOF  Baseline: 10/07/22 Goal status: INITIAL   2.  Improve 5xSTS by MCID of 5" and by MCID of 62ft as indication of improved functional mobility  Baseline: see functional tests Goal status: INITIAL   3.  Improve SLS to greater than 15" as demonstration of improved stability/balance  Baseline: TBA Goal status: INITIAL   4.  Improve pt's core strength as demonstrated by maintaining a bridge for 60" and a plank from her knees for 30" Baseline: TBA Goal status: INITIAL   5.  Improve pt's bilat hip strength for 4+/5 for improved stability and function Baseline: see flow sheets Goal status: INITIAL   6.  Pt will report a decrease in LBP and L LE pain to 5/10 or for improved function and QOL Baseline: 4-9/10 Goal status: INITIAL     PLAN:   PT FREQUENCY: 2x/week   PT DURATION: other: 7 weeks   PLANNED INTERVENTIONS: Therapeutic exercises, Therapeutic activity, Balance training, Gait training, Patient/Family education, Self Care, Joint mobilization, Stair training, Aquatic Therapy, Dry Needling, Electrical stimulation, Spinal mobilization, Cryotherapy, Moist heat, Taping, Ionotophoresis /ml Dexamethasone, Manual therapy, and Re-evaluation   PLAN FOR NEXT SESSION: Review FOTO; assess response to HEP; progress therex as indicated; use of modalities, manual therapy; and TPDN as indicated.  Ferol Laiche MS, PT 09/22/22 10:03 PM

## 2022-09-22 ENCOUNTER — Ambulatory Visit: Payer: No Typology Code available for payment source

## 2022-09-22 DIAGNOSIS — M5459 Other low back pain: Secondary | ICD-10-CM

## 2022-09-22 DIAGNOSIS — M6281 Muscle weakness (generalized): Secondary | ICD-10-CM | POA: Diagnosis not present

## 2022-09-22 DIAGNOSIS — R262 Difficulty in walking, not elsewhere classified: Secondary | ICD-10-CM

## 2022-09-22 DIAGNOSIS — R208 Other disturbances of skin sensation: Secondary | ICD-10-CM

## 2022-10-11 ENCOUNTER — Ambulatory Visit: Payer: No Typology Code available for payment source

## 2022-10-13 ENCOUNTER — Ambulatory Visit: Payer: No Typology Code available for payment source

## 2022-10-18 ENCOUNTER — Ambulatory Visit: Payer: No Typology Code available for payment source | Attending: Neurosurgery

## 2022-10-18 DIAGNOSIS — R262 Difficulty in walking, not elsewhere classified: Secondary | ICD-10-CM | POA: Diagnosis present

## 2022-10-18 DIAGNOSIS — M6281 Muscle weakness (generalized): Secondary | ICD-10-CM | POA: Insufficient documentation

## 2022-10-18 DIAGNOSIS — M5459 Other low back pain: Secondary | ICD-10-CM | POA: Diagnosis present

## 2022-10-18 DIAGNOSIS — R208 Other disturbances of skin sensation: Secondary | ICD-10-CM | POA: Insufficient documentation

## 2022-10-18 NOTE — Therapy (Signed)
OUTPATIENT PHYSICAL THERAPY TREATMENT NOTE/Re-Cert   Patient Name: Abigail Reynolds MRN: [id number] DOB:03/28/1976, 47 y.o., female Today's Date: 10/18/2022  PCP: [facility]   REFERRING PROVIDER: Lisbeth Renshaw, MD   END OF SESSION:   PT End of Session - 10/18/22 1456     Visit Number 7    Number of Visits 19    Date for PT Re-Evaluation 12/09/22    Authorization Type [place]    Authorization Time Period 08/31/2022 - 03/04/2023    Authorization - Visit Number 7    Authorization - Number of Visits 19    Progress Note Due on Visit 10    PT Start Time 1417    PT Stop Time 1500    PT Time Calculation (min) 43 min    Activity Tolerance Patient tolerated treatment well    Behavior During Therapy WFL for tasks assessed/performed                Past Medical History:  Diagnosis Date   Arthritis    Depression    GERD (gastroesophageal reflux disease)    Headache    PTSD (post-traumatic stress disorder)    Seizures (HCC)    FOCAL SEIZURES  NO IN 2 YEARS "last sezuire over 5 years ago" per pt   Past Surgical History:  Procedure Laterality Date   BACK SURGERY     L4 AND L5 2014   CERVICAL SPINE SURGERY     C 4 AND C5   COLONOSCOPY     DILATION AND CURETTAGE OF UTERUS     DILITATION & CURRETTAGE/HYSTROSCOPY WITH HYDROTHERMAL ABLATION N/A 01/01/2014   Procedure: DILATATION & CURETTAGE/HYSTEROSCOPY WITH attempted HYDROTHERMAL ABLATION;  Surgeon: Kathreen Cosier, MD;  Location: [facility];  Service: Gynecology;  Laterality: N/A;   LASIK     TONSILLECTOMY     TUBAL LIGATION     Patient Active Problem List   Diagnosis Date Noted   Spondylolisthesis of lumbar region 06/03/2022   Ankle instability 06/28/2021   Hypermobility syndrome 06/28/2021   Patellofemoral arthritis 06/28/2021    REFERRING DIAG: M43.16 (ICD-10-CM) - Spondylolisthesis, lumbar region   THERAPY DIAG:  Muscle weakness (generalized)  Difficulty in walking, not elsewhere  classified  Other disturbances of skin sensation  Other low back pain  Rationale for Evaluation and Treatment Rehabilitation  ONSET DATE: POSTERIOR LUMBAR INTERBODY FUSION LUMBAR THREE-FOUR/LUMBAR FOUR-FIVE 06/03/22. Hx of chronic LBP   SUBJECTIVE:    SUBJECTIVE STATEMENT: Pt reports her back was hurting more for 3 days after the last PT session, but it did resolve. Pt notes she has to complete activities in intervals, ie sitting, walking , household chores, to tolerate them. 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   PERTINENT HISTORY: Neck pain, Cervical surgery, PTSD, HAs   PAIN:  Are you having pain? Yes: NPRS scale:3/10  Pain location: low back Pain description: pressure  Aggravating factors: prolonged sitting (20 min), standing and walking (30 mins standing and walking) Relieving factors: changing positions, lying down Pain range on 4-9/10   PRECAUTIONS: Back 5# lifting restriction; and No BLT as per pt. Spoke with Dr. Val Riles office and the pt's lifting and movement restrictions have been lifted.   WEIGHT BEARING RESTRICTIONS: No   FALLS:  Has patient fallen in last 6 months? 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   Fluoroscopy time: 54 seconds.   Cumulative air Kerma 38.13 mGy   Images: 2   EXAM: LUMBAR SPINE - 2-3 VIEW   COMPARISON:  None Available.   FINDINGS: Imaging was provided during placement  of pedicle rods and screws at L4, L5, and S1. The right-sided L4 screw distal tip appears to extend just above the superior endplate. Hardware is otherwise in good position. Disc spacer devices are noted.   IMPRESSION: The right L4 screw distal tip appears to extend just above the superior endplate. Hardware is otherwise in good position.     On: 06/03/2022 12:38      PATIENT SURVEYS:  FOTO: Perceived function   47%, predicted   54%    COGNITION: Overall cognitive status: Within functional limits for tasks assessed                         SENSATION: WFL   EDEMA:             None observed    MUSCLE LENGTH: Hamstrings: Right  50 deg; Left 45 deg Thomas test: Right NT deg; Left NT deg   POSTURE: rounded shoulders and forward head   PALPATION: TTP R low back paraspinals and QL with increase muscle tension   LOWER EXTREMITY MMT: Decreased core strength MMT ROM Right eval Left eval  Hip flexion 3+ 3+  Hip extension 3 3  Hip abduction 3 3  Hip adduction      Hip internal rotation      Hip external rotation 3+ 3+  Knee flexion 4+ 4+  Knee extension 4+ 4+  Ankle dorsiflexion 4 4  Ankle plantarflexion      Ankle inversion      Ankle eversion       (Blank rows = not tested)   LOWER EXTREMITY ROM: Grossly WNLs ROM Right eval Left eval  Hip flexion      Hip extension      Hip abduction      Hip adduction      Hip internal rotation      Hip external rotation      Knee flexion      Knee extension      Ankle dorsiflexion      Ankle plantarflexion      Ankle inversion      Ankle eversion       (Blank rows = not tested)   Lumbar ROM: NT due to pt reported mobility restrictions   LOWER EXTREMITY SPECIAL TESTS:  NT   FUNCTIONAL TESTS:  5 times sit to stand: 08/22/22: 51 seconds with UE standard mat 2 minute walk test: 323 feet 08/22/22 SLS: TBA   GAIT: Distance walked: 241ft Assistive device utilized: None Level of assistance: Complete  Independence Comments: Decreased pace     TODAY'S TREATMENT: [facility] Adult PT Treatment:  DATE: 10/18/22 Therapeutic Exercise: Nustep L5 6 mins UE/LE Hip flexor stretch in standing x1 20" bilat SKTC x1 20" each DKTC x1 20" 90/90 AB bracing x5 10" Bridge c PPT x10 STS x10 Shoulder row x15 GTB Shoulder ext x 15 GTB  [facility] Adult PT Treatment:                                                DATE: 09/22/22 Therapeutic Exercise: Nustep L5 6 mins UE/LE SKTC x1 20" each DKTC x2 20 LTR with cues to keep comfortable x5 90/90 AB bracing x5 10" Bridge c PPT x10 STS x10 Paloff press double RTB x10 each SLS each  [facility] Adult PT Treatment:                                                DATE: 09/20/22 Therapeutic Exercise: Nustep L5 6 mins UE/LE SKTC x2 20" each DKTC x2 20 LTR with cues to keep comfortable PPT x15 3sec Bridge c PPT x10 STS x10 Hip flexor stretch in standing x1 30" L Shoulder row x15 GTB Shoulder ext x 15 GTB   PATIENT EDUCATION:  Education details: Eval findings, POC, HEP, self care  Person educated: Patient Education method: Explanation, Demonstration, Tactile cues, Verbal cues, and Handouts Education comprehension: verbalized understanding, returned demonstration, verbal cues required, and tactile cues required   HOME EXERCISE PROGRAM: Access Code: [email or web address] URL: [email or web address] Date: 08/24/2022 Prepared by: Joellyn Rued  Exercises - Standing Shoulder Row with Anchored Resistance  - 1-2 x daily - 7 x weekly - 2-3 sets - 10 reps - Shoulder extension with resistance - Neutral  - 1-2 x daily - 7 x weekly - 2-3 sets - 10 reps - Mini Squat with Counter Support  - 1-2 x daily - 7 x weekly - 2-3 sets - 10 reps - Supine Posterior Pelvic Tilt  - 1-2 x daily - 7 x weekly - 2-3 sets - 10 reps - 3 hold - Supine Bridge  - 1-2 x daily - 7 x weekly - 1 sets - 5-10 reps - 3 hold - Hooklying Single Knee to Chest  -  1-2 x daily - 7 x weekly - 1 sets - 3 reps - 20 hold - Seated Flexion Stretch with Swiss Ball  - 1-2 x daily - 7 x weekly - 1 sets - 5-10 reps - 5-20 hold - Supine Lower Trunk Rotation  - 1 x daily - 7 x weekly - 1 sets - 5-10 reps - 10 hold - Modified Thomas Stretch  - 1 x daily - 7 x weekly - 1 sets - 2-3 reps - 30 hold - Standing Hip Flexor Stretch  - 1 x daily - 7 x weekly - 1 sets - 2-3 reps - 30 hold    ASSESSMENT:   CLINICAL IMPRESSION: Pt returns to PT following approx 1 month being away due to a family emergency. Pt is now scheduled for PT consistently for several weeks, so the posiblility of improving strength and function is improved. PT was continued for lumbopelvic flexibility and strengthening. Pt tolerated PT today without adverse effects. Will gradually progress lumbopelvic strengthening as tolerated. Pt will continue to benefit from skilled PT to address impairments for  improved function with less pain.  OBJECTIVE IMPAIRMENTS: decreased activity tolerance, decreased balance, decreased mobility, difficulty walking, decreased ROM, increased muscle spasms, impaired flexibility, and pain.    ACTIVITY LIMITATIONS: carrying, lifting, bending, sitting, standing, squatting, sleeping, bathing, toileting, dressing, reach over head, locomotion level, and caring for others   PARTICIPATION LIMITATIONS: meal prep, cleaning, laundry, driving, community activity, and occupation   PERSONAL FACTORS: Fitness, Past/current experiences, Profession, and Time since onset of injury/illness/exacerbation are also affecting patient's functional outcome.    REHAB POTENTIAL: Good   CLINICAL DECISION MAKING: Evolving/moderate complexity   EVALUATION COMPLEXITY: Moderate     GOALS:   SHORT TERM GOALS: Target date: 09/02/22 Pt will be Ind in an initial HEP  Baseline: initiated Goal status: MET 09/20/22   LONG TERM GOALS: Target date: 12/09/22   Pt will be Ind in a final HEP to maintain achieved LOF   Baseline: 10/07/22 Goal status: Ongoing   2.  Improve 5xSTS by MCID of 5" and by MCID of 25ft as indication of improved functional mobility  Baseline: see functional tests Goal status: Ongoing   3.  Improve SLS to greater than 15" as demonstration of improved stability/balance  Baseline: TBA Goal status: Ongoing   4.  Improve pt's core strength as demonstrated by maintaining a bridge for 60" and a plank from her knees for 30" Baseline: TBA Goal status: Ongoing   5.  Improve pt's bilat hip strength for 4+/5 for improved stability and function Baseline: see flow sheets Goal status: Ongoing   6.  Pt will report a decrease in LBP and L LE pain to 5/10 or for improved function and QOL Baseline: 4-9/10 Goal status: Ongoing     PLAN:   PT FREQUENCY: 2x/week   PT DURATION: other: 6 weeks   PLANNED INTERVENTIONS: Therapeutic exercises, Therapeutic activity, Balance training, Gait training, Patient/Family education, Self Care, Joint mobilization, Stair training, Aquatic Therapy, Dry Needling, Electrical stimulation, Spinal mobilization, Cryotherapy, Moist heat, Taping, Ionotophoresis 4mg /ml Dexamethasone, Manual therapy, and Re-evaluation   PLAN FOR NEXT SESSION: Review FOTO; assess response to HEP; progress therex as indicated; use of modalities, manual therapy; and TPDN as indicated.  Pheonix Clinkscale MS, PT 10/18/22 5:33 PM

## 2022-10-19 NOTE — Therapy (Signed)
OUTPATIENT PHYSICAL THERAPY TREATMENT NOTE/Re-Cert   Patient Name: Abigail Reynolds MRN: [id number] DOB:[birth year], 47 y.o., female Today's Date: 10/20/2022  [facility]: [facility]   REFERRING PROVIDER: Lisbeth Renshaw, MD   END OF SESSION:   PT End of Session - 10/20/22 1423     Visit Number 8    Number of Visits 19    Date for PT Re-Evaluation 12/09/22    Authorization Type [place]    Authorization Time Period 08/31/2022 - 03/04/2023    Authorization - Visit Number 8    Authorization - Number of Visits 19    PT Start Time 1419    PT Stop Time 1500    PT Time Calculation (min) 41 min    Activity Tolerance Patient tolerated treatment well    Behavior During Therapy WFL for tasks assessed/performed                 Past Medical History:  Diagnosis Date   Arthritis    Depression    GERD (gastroesophageal reflux disease)    Headache    PTSD (post-traumatic stress disorder)    Seizures (HCC)    FOCAL SEIZURES  NO IN 2 YEARS "last sezuire over 5 years ago" per pt   Past Surgical History:  Procedure Laterality Date   BACK SURGERY     L4 AND L5 2014   CERVICAL SPINE SURGERY     C 4 AND C5   COLONOSCOPY     DILATION AND CURETTAGE OF UTERUS     DILITATION & CURRETTAGE/HYSTROSCOPY WITH HYDROTHERMAL ABLATION N/A 01/01/2014   Procedure: DILATATION & CURETTAGE/HYSTEROSCOPY WITH attempted HYDROTHERMAL ABLATION;  Surgeon: Kathreen Cosier, MD;  Location: [facility];  Service: Gynecology;  Laterality: N/A;   LASIK     TONSILLECTOMY     TUBAL LIGATION     Patient Active Problem List   Diagnosis Date Noted   Spondylolisthesis of lumbar region 06/03/2022   Ankle instability 06/28/2021   Hypermobility syndrome 06/28/2021   Patellofemoral arthritis 06/28/2021    REFERRING DIAG: M43.16 (ICD-10-CM) - Spondylolisthesis, lumbar region   THERAPY DIAG:  Muscle weakness (generalized)  Difficulty in walking, not elsewhere classified  Other disturbances of skin  sensation  Other low back pain  Rationale for Evaluation and Treatment Rehabilitation  ONSET DATE: POSTERIOR LUMBAR INTERBODY FUSION LUMBAR THREE-FOUR/LUMBAR FOUR-FIVE 06/03/22. Hx of chronic LBP   SUBJECTIVE:    SUBJECTIVE STATEMENT: Pt reports she walked 2 blocks yesterday and was pleased that she tolerated it with a tolerable increase in pain.   PERTINENT HISTORY: Neck pain, Cervical surgery, PTSD, HAs   PAIN:  Are you having pain? Yes: NPRS scale: 6/10  Pain location: low back Pain description: pressure  Aggravating factors: prolonged sitting (20 min), standing and walking (30 mins standing and walking) Relieving factors: changing positions, lying down Pain range on 4-9/10   PRECAUTIONS: Back 5# lifting restriction; and No BLT as per pt. Spoke with Dr. Val Riles office and the pt's lifting and movement restrictions have been lifted.   WEIGHT BEARING RESTRICTIONS: No   FALLS:  Has patient fallen in last 6 months? 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   Fluoroscopy time: 54 seconds.   Cumulative air Kerma 38.13 mGy   Images: 2   EXAM: LUMBAR SPINE - 2-3 VIEW   COMPARISON:  None Available.   FINDINGS: Imaging was provided during placement of pedicle rods and screws at L4, L5, and S1. The right-sided L4 screw distal tip appears to extend just above the superior endplate. Hardware is otherwise in good position. Disc spacer devices are noted.   IMPRESSION: The  right L4 screw distal tip appears to extend just above the superior endplate. Hardware is otherwise in good position.     On: 06/03/2022 12:38      PATIENT SURVEYS:  FOTO: Perceived function   47%, predicted   54%    COGNITION: Overall cognitive status: Within functional limits for tasks assessed                         SENSATION: WFL   EDEMA:             None observed    MUSCLE LENGTH: Hamstrings: Right  50 deg; Left 45 deg Thomas test: Right NT deg; Left NT deg   POSTURE: rounded shoulders and forward head   PALPATION: TTP R low back paraspinals and QL with increase muscle tension   LOWER EXTREMITY MMT: Decreased core strength MMT ROM Right eval Left eval  Hip flexion 3+ 3+  Hip extension 3 3  Hip abduction 3 3  Hip adduction      Hip internal rotation      Hip external rotation 3+ 3+  Knee flexion 4+ 4+  Knee extension 4+ 4+  Ankle dorsiflexion 4 4  Ankle plantarflexion      Ankle inversion      Ankle eversion       (Blank rows = not tested)   LOWER EXTREMITY ROM: Grossly WNLs ROM Right eval Left eval  Hip flexion      Hip extension      Hip abduction      Hip adduction      Hip internal rotation      Hip external rotation      Knee flexion      Knee extension      Ankle dorsiflexion      Ankle plantarflexion      Ankle inversion      Ankle eversion       (Blank rows = not tested)   Lumbar ROM: NT due to pt reported mobility restrictions   LOWER EXTREMITY SPECIAL TESTS:  NT   FUNCTIONAL TESTS:  5 times sit to stand: 08/22/22: 51 seconds with UE standard mat 2 minute walk test: 323 feet 08/22/22 SLS: TBA   GAIT: Distance walked: 262ft Assistive device utilized: None Level of assistance: Complete Independence Comments: Decreased pace     TODAY'S TREATMENT: [facility] Adult PT Treatment:                                                DATE: 10/20/22 Therapeutic Exercise: Nustep L5 6 mins UE/LE Hip flexor stretch in standing x1 20"  bilat SKTC x1 20" each DKTC x1 20" LTR with cues to keep comfortable  x5 Seated trunk flexion forward and laterally STS x10 Shoulder row x15 GTB Shoulder ext x 15 GTB Paloff press x15 single GTB Knee ext omega 2x10 15# Standing hip abd 2x10  [facility] Adult PT Treatment:                                                DATE: 10/18/22 Therapeutic Exercise: Nustep L5 6 mins UE/LE Hip flexor stretch in standing x1 20" bilat SKTC x1 20" each DKTC x1 20" 90/90 AB bracing x5 10" Bridge c PPT x10 STS x10 Shoulder row x15 GTB Shoulder ext x 15 GTB  [facility] Adult PT Treatment:                                                DATE: 09/22/22 Therapeutic Exercise: Nustep L5 6 mins UE/LE SKTC x1 20" each DKTC x2 20 LTR with cues to keep comfortable x5 90/90 AB bracing x5 10" Bridge c PPT x10 STS x10 Paloff press double RTB x10 each SLS each   PATIENT EDUCATION:  Education details: Eval findings, POC, HEP, self care  Person educated: Patient Education method: Explanation, Demonstration, Tactile cues, Verbal cues, and Handouts Education comprehension: verbalized understanding, returned demonstration, verbal cues required, and tactile cues required   HOME EXERCISE PROGRAM: Access Code: [email or web address] URL: [email or web address] Date: 08/24/2022 Prepared by: Joellyn Rued  Exercises - Standing Shoulder Row with Anchored Resistance  - 1-2 x daily - 7 x weekly - 2-3 sets - 10 reps - Shoulder extension with resistance - Neutral  - 1-2 x daily - 7 x weekly - 2-3 sets - 10 reps - Mini Squat with Counter Support  - 1-2 x daily - 7 x weekly - 2-3 sets - 10 reps - Supine Posterior Pelvic Tilt  - 1-2 x daily - 7 x weekly - 2-3 sets - 10 reps - 3 hold - Supine Bridge  - 1-2 x daily - 7 x weekly - 1 sets - 5-10 reps - 3 hold - Hooklying Single Knee to Chest  - 1-2 x daily - 7 x weekly - 1 sets - 3 reps - 20 hold - Seated Flexion Stretch with Swiss Ball  - 1-2 x daily - 7 x weekly - 1 sets - 5-10  reps - 5-20 hold - Supine Lower Trunk Rotation  - 1 x daily - 7 x weekly - 1 sets - 5-10 reps - 10 hold - Modified Thomas Stretch  - 1 x daily - 7 x weekly - 1 sets - 2-3 reps - 30 hold - Standing Hip Flexor Stretch  - 1 x daily - 7 x weekly - 1 sets - 2-3 reps - 30 hold    ASSESSMENT:   CLINICAL IMPRESSION: PT was completed for lumbopelvic flexibility and strengthening. A gradual progresive demand was provided. Pt tolerated PT today without adverse effects. Pt will continue to benefit from skilled PT to address impairments for improved function.    OBJECTIVE IMPAIRMENTS: decreased activity tolerance, decreased balance, decreased mobility, difficulty walking, decreased ROM, increased muscle spasms, impaired flexibility, and pain.    ACTIVITY LIMITATIONS: carrying, lifting, bending, sitting, standing, squatting, sleeping, bathing, toileting, dressing, reach over head, locomotion level, and caring for others   PARTICIPATION  LIMITATIONS: meal prep, cleaning, laundry, driving, community activity, and occupation   PERSONAL FACTORS: Fitness, Past/current experiences, Profession, and Time since onset of injury/illness/exacerbation are also affecting patient's functional outcome.    REHAB POTENTIAL: Good   CLINICAL DECISION MAKING: Evolving/moderate complexity   EVALUATION COMPLEXITY: Moderate     GOALS:   SHORT TERM GOALS: Target date: 09/02/22 Pt will be Ind in an initial HEP  Baseline: initiated Goal status: MET 09/20/22   LONG TERM GOALS: Target date: 12/09/22   Pt will be Ind in a final HEP to maintain achieved LOF  Baseline: 10/07/22 Goal status: Ongoing   2.  Improve 5xSTS by MCID of 5" and by MCID of 72ft as indication of improved functional mobility  Baseline: see functional tests Goal status: Ongoing   3.  Improve SLS to greater than 15" as demonstration of improved stability/balance  Baseline: TBA Goal status: Ongoing   4.  Improve pt's core strength as demonstrated  by maintaining a bridge for 60" and a plank from her knees for 30" Baseline: TBA Goal status: Ongoing   5.  Improve pt's bilat hip strength for 4+/5 for improved stability and function Baseline: see flow sheets Goal status: Ongoing   6.  Pt will report a decrease in LBP and L LE pain to 5/10 or for improved function and QOL Baseline: 4-9/10 Goal status: Ongoing     PLAN:   PT FREQUENCY: 2x/week   PT DURATION: other: 6 weeks   PLANNED INTERVENTIONS: Therapeutic exercises, Therapeutic activity, Balance training, Gait training, Patient/Family education, Self Care, Joint mobilization, Stair training, Aquatic Therapy, Dry Needling, Electrical stimulation, Spinal mobilization, Cryotherapy, Moist heat, Taping, Ionotophoresis 4mg /ml Dexamethasone, Manual therapy, and Re-evaluation   PLAN FOR NEXT SESSION: Review FOTO; assess response to HEP; progress therex as indicated; use of modalities, manual therapy; and TPDN as indicated.  Georjean Toya MS, PT 10/20/22 3:02 PM

## 2022-10-20 ENCOUNTER — Ambulatory Visit: Payer: No Typology Code available for payment source

## 2022-10-20 DIAGNOSIS — M6281 Muscle weakness (generalized): Secondary | ICD-10-CM

## 2022-10-20 DIAGNOSIS — M5459 Other low back pain: Secondary | ICD-10-CM

## 2022-10-20 DIAGNOSIS — R208 Other disturbances of skin sensation: Secondary | ICD-10-CM

## 2022-10-20 DIAGNOSIS — R262 Difficulty in walking, not elsewhere classified: Secondary | ICD-10-CM

## 2022-10-25 ENCOUNTER — Ambulatory Visit: Payer: No Typology Code available for payment source

## 2022-10-25 DIAGNOSIS — R262 Difficulty in walking, not elsewhere classified: Secondary | ICD-10-CM

## 2022-10-25 DIAGNOSIS — M6281 Muscle weakness (generalized): Secondary | ICD-10-CM

## 2022-10-25 DIAGNOSIS — M5459 Other low back pain: Secondary | ICD-10-CM

## 2022-10-25 NOTE — Therapy (Signed)
OUTPATIENT PHYSICAL THERAPY TREATMENT NOTE   Patient Name: Abigail Reynolds MRN: [id number] DOB:12-29-1975, 47 y.o., female Today's Date: 10/25/2022  PCP: [facility]   REFERRING PROVIDER: Lisbeth Renshaw, MD   END OF SESSION:   PT End of Session - 10/25/22 1428     Visit Number 9    Number of Visits 19    Date for PT Re-Evaluation 12/09/22    Authorization Type [place]    Authorization Time Period 08/31/2022 - 03/04/2023    Authorization - Visit Number 9    Authorization - Number of Visits 19    Progress Note Due on Visit 10    PT Start Time 1423    PT Stop Time 1503    PT Time Calculation (min) 40 min    Activity Tolerance Patient tolerated treatment well    Behavior During Therapy WFL for tasks assessed/performed                 Past Medical History:  Diagnosis Date   Arthritis    Depression    GERD (gastroesophageal reflux disease)    Headache    PTSD (post-traumatic stress disorder)    Seizures (HCC)    FOCAL SEIZURES  NO IN 2 YEARS "last sezuire over 5 years ago" per pt   Past Surgical History:  Procedure Laterality Date   BACK SURGERY     L4 AND L5 2014   CERVICAL SPINE SURGERY     C 4 AND C5   COLONOSCOPY     DILATION AND CURETTAGE OF UTERUS     DILITATION & CURRETTAGE/HYSTROSCOPY WITH HYDROTHERMAL ABLATION N/A 01/01/2014   Procedure: DILATATION & CURETTAGE/HYSTEROSCOPY WITH attempted HYDROTHERMAL ABLATION;  Surgeon: Kathreen Cosier, MD;  Location: [facility];  Service: Gynecology;  Laterality: N/A;   LASIK     TONSILLECTOMY     TUBAL LIGATION     Patient Active Problem List   Diagnosis Date Noted   Spondylolisthesis of lumbar region 06/03/2022   Ankle instability 06/28/2021   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 Hx of chronic LBP   SUBJECTIVE:    SUBJECTIVE STATEMENT: Pt reports she is having a good day.   PERTINENT HISTORY: Neck pain, Cervical surgery, PTSD, HAs   PAIN:  Are you having pain? Yes: NPRS scale: 3-4/10  Pain location: low back Pain description: pressure  Aggravating factors: prolonged sitting (20 min), standing and walking (30 mins standing and walking) Relieving factors: changing positions, lying down Pain range on 4-9/10   PRECAUTIONS: Back 5# lifting restriction; and No BLT as per pt. Spoke with Dr. Val Riles office and the pt's lifting and movement restrictions have been lifted.   WEIGHT BEARING RESTRICTIONS: No   FALLS:  Has patient fallen in last 6 months? 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   Fluoroscopy time: 54 seconds.   Cumulative air Kerma 38.13 mGy   Images: 2   EXAM: LUMBAR SPINE - 2-3 VIEW   COMPARISON:  None Available.   FINDINGS: Imaging was provided during placement of pedicle rods and screws at L4, L5, and S1. The right-sided L4 screw distal tip appears to extend just above the superior endplate. Hardware is otherwise in good position. Disc spacer devices are noted.   IMPRESSION: The right L4 screw distal tip appears to extend just above the superior endplate. Hardware is  otherwise in good position.     On: 06/03/2022 12:38      PATIENT SURVEYS:  FOTO: Perceived function   47%, predicted   54%    COGNITION: Overall cognitive status: Within functional limits for tasks assessed                         SENSATION: WFL   EDEMA:             None observed    MUSCLE LENGTH: Hamstrings: Right  50 deg; Left 45 deg Thomas test: Right NT deg; Left NT deg   POSTURE: rounded shoulders and forward head   PALPATION: TTP R low back paraspinals and QL with increase muscle tension   LOWER EXTREMITY MMT: Decreased core strength MMT ROM Right eval Left eval  Hip flexion 3+ 3+  Hip extension 3 3  Hip abduction 3 3  Hip adduction      Hip internal rotation      Hip external rotation 3+ 3+  Knee flexion 4+ 4+  Knee extension 4+ 4+  Ankle dorsiflexion 4 4  Ankle plantarflexion      Ankle inversion      Ankle eversion       (Blank rows = not tested)   LOWER EXTREMITY ROM: Grossly WNLs ROM Right eval Left eval  Hip flexion      Hip extension      Hip abduction      Hip adduction      Hip internal rotation      Hip external rotation      Knee flexion      Knee extension      Ankle dorsiflexion      Ankle plantarflexion      Ankle inversion      Ankle eversion       (Blank rows = not tested)   Lumbar ROM: NT due to pt reported mobility restrictions   LOWER EXTREMITY SPECIAL TESTS:  NT   FUNCTIONAL TESTS:  5 times sit to stand: 08/22/22: 51 seconds with UE standard mat 2 minute walk test: 323 feet 08/22/22 SLS: TBA   GAIT: Distance walked: 286ft Assistive device utilized: None Level of assistance: Complete Independence Comments: Decreased pace     TODAY'S TREATMENT: [facility] Adult PT Treatment:                                                DATE: 10/25/22 Therapeutic Exercise: Nustep L5 6 mins UE/LE 5xSTS x2 Cybex leg press 2x10 40# Wall cat/camel and L stretch Wall stretch lateral and lateral L stretch Shoulder row omega 2x10  20# Lat pull down 2x10 20# Marching  on airex mat 2x10 each leg  [facility] Adult PT Treatment:                                                DATE: 10/20/22 Therapeutic Exercise: Nustep L5 6 mins UE/LE Hip flexor stretch in standing x1 20" bilat SKTC x1 20" each DKTC x1 20" LTR with cues to keep comfortable x5 Seated trunk flexion forward and laterally STS x10 Shoulder row x15 GTB Shoulder ext x 15 GTB Paloff press x15 single GTB Knee ext omega 2x10 15# Standing hip abd 2x10  [facility] Adult PT Treatment:                                                DATE: 10/18/22 Therapeutic Exercise: Nustep L5 6 mins UE/LE Hip flexor stretch in standing x1 20" bilat SKTC x1 20" each DKTC x1 20" 90/90 AB bracing x5 10" Bridge c PPT x10 STS x10 Shoulder row x15 GTB Shoulder ext x 15 GTB   PATIENT EDUCATION:  Education details: Eval findings, POC, HEP, self care  Person educated: Patient Education method: Explanation, Demonstration, Tactile cues, Verbal cues, and Handouts Education comprehension: verbalized understanding, returned demonstration, verbal cues required, and tactile cues required   HOME EXERCISE PROGRAM: Access Code: [email or web address] URL: [email or web address] Date: 08/24/2022 Prepared by: Joellyn Rued  Exercises - Standing Shoulder Row with Anchored Resistance  - 1-2 x daily - 7 x weekly - 2-3 sets - 10 reps - Shoulder extension with resistance - Neutral  - 1-2 x daily - 7 x weekly - 2-3 sets - 10 reps - Mini Squat with Counter Support  - 1-2 x daily - 7 x weekly - 2-3 sets - 10 reps - Supine Posterior Pelvic Tilt  - 1-2 x daily - 7 x weekly - 2-3 sets - 10 reps - 3 hold - Supine Bridge  - 1-2 x daily - 7 x weekly - 1 sets - 5-10 reps - 3 hold - Hooklying Single Knee to Chest  - 1-2 x daily - 7 x weekly - 1 sets - 3 reps - 20 hold - Seated Flexion Stretch with Swiss Ball  - 1-2 x daily - 7 x weekly - 1 sets - 5-10 reps - 5-20 hold - Supine Lower Trunk Rotation  - 1 x daily -  7 x weekly - 1 sets - 5-10 reps - 10 hold - Modified Thomas Stretch  - 1 x daily - 7 x weekly - 1 sets - 2-3 reps - 30 hold - Standing Hip Flexor Stretch  - 1 x daily - 7 x weekly - 1 sets - 2-3 reps - 30 hold    ASSESSMENT:   CLINICAL IMPRESSION: Pt participated in PT for lumbopelvic flexibility and strengthening. Reassessed 5xSTS for which the pt has made good progress, indicating improvement with strength and function. Pt tolerated PT today without adverse effects. Pt will continue to benefit from skilled PT to address impairments for improved trunk function will less pain.   OBJECTIVE IMPAIRMENTS: decreased activity tolerance, decreased balance, decreased mobility, difficulty walking, decreased ROM, increased muscle spasms, impaired flexibility, and pain.    ACTIVITY LIMITATIONS: carrying, lifting, bending, sitting, standing, squatting, sleeping, bathing, toileting, dressing, reach over head, locomotion  level, and caring for others   PARTICIPATION LIMITATIONS: meal prep, cleaning, laundry, driving, community activity, and occupation   PERSONAL FACTORS: Fitness, Past/current experiences, Profession, and Time since onset of injury/illness/exacerbation are also affecting patient's functional outcome.    REHAB POTENTIAL: Good   CLINICAL DECISION MAKING: Evolving/moderate complexity   EVALUATION COMPLEXITY: Moderate     GOALS:   SHORT TERM GOALS: Target date: 09/02/22 Pt will be Ind in an initial HEP  Baseline: initiated Goal status: MET 09/20/22   LONG TERM GOALS: Target date: 12/09/22   Pt will be Ind in a final HEP to maintain achieved LOF  Baseline: 10/07/22 Goal status: Ongoing   2.  Improve 5xSTS by MCID of 5" and by MCID of 16ft as indication of improved functional mobility  Baseline: see functional tests Status: 10/25/22: 5xSTS=22.1 s use of hands Goal status: MET for 5xSTS   3.  Improve SLS to greater than 15" as demonstration of improved stability/balance   Baseline: TBA Goal status: Ongoing   4.  Improve pt's core strength as demonstrated by maintaining a bridge for 60" and a plank from her knees for 30" Baseline: TBA Goal status: Ongoing   5.  Improve pt's bilat hip strength for 4+/5 for improved stability and function Baseline: see flow sheets Goal status: Ongoing   6.  Pt will report a decrease in LBP and L LE pain to 5/10 or for improved function and QOL Baseline: 4-9/10 Goal status: Ongoing     PLAN:   PT FREQUENCY: 2x/week   PT DURATION: other: 6 weeks   PLANNED INTERVENTIONS: Therapeutic exercises, Therapeutic activity, Balance training, Gait training, Patient/Family education, Self Care, Joint mobilization, Stair training, Aquatic Therapy, Dry Needling, Electrical stimulation, Spinal mobilization, Cryotherapy, Moist heat, Taping, Ionotophoresis 4mg /ml Dexamethasone, Manual therapy, and Re-evaluation   PLAN FOR NEXT SESSION: Review FOTO; assess response to HEP; progress therex as indicated; use of modalities, manual therapy; and TPDN as indicated.  Callista Hoh MS, PT 10/25/22 5:27 PM

## 2022-10-27 ENCOUNTER — Ambulatory Visit: Payer: No Typology Code available for payment source

## 2022-10-27 DIAGNOSIS — R262 Difficulty in walking, not elsewhere classified: Secondary | ICD-10-CM

## 2022-10-27 DIAGNOSIS — M5459 Other low back pain: Secondary | ICD-10-CM

## 2022-10-27 DIAGNOSIS — R208 Other disturbances of skin sensation: Secondary | ICD-10-CM

## 2022-10-27 DIAGNOSIS — M6281 Muscle weakness (generalized): Secondary | ICD-10-CM | POA: Diagnosis not present

## 2022-10-27 NOTE — Therapy (Signed)
OUTPATIENT PHYSICAL THERAPY TREATMENT NOTE/Progress note   Patient Name: Abigail Reynolds MRN: [id number] DOB:06-Jun-1975, 47 y.o., female Today's Date: 10/27/2022  [facility]   REFERRING PROVIDER: Lisbeth Renshaw, MD   Progress Note Reporting Period 08/10/22 to 09/3022  See note below for Objective Data and Assessment of Progress/Goals.      END OF SESSION:   PT End of Session - 10/27/22 1417     Visit Number 10    Number of Visits 19    Date for PT Re-Evaluation 12/09/22    Authorization Type [place]    Authorization Time Period 08/31/2022 - 03/04/2023    Authorization - Visit Number 10    Authorization - Number of Visits 19    PT Start Time 1416    PT Stop Time 1459    PT Time Calculation (min) 43 min    Activity Tolerance Patient tolerated treatment well    Behavior During Therapy WFL for tasks assessed/performed                  Past Medical History:  Diagnosis Date   Arthritis    Depression    GERD (gastroesophageal reflux disease)    Headache    PTSD (post-traumatic stress disorder)    Seizures (HCC)    FOCAL SEIZURES  NO IN 2 YEARS "last sezuire over 5 years ago" per pt   Past Surgical History:  Procedure Laterality Date   BACK SURGERY     L4 AND L5 2014   CERVICAL SPINE SURGERY     C 4 AND C5   COLONOSCOPY     DILATION AND CURETTAGE OF UTERUS     DILITATION & CURRETTAGE/HYSTROSCOPY WITH HYDROTHERMAL ABLATION N/A 01/01/2014   Procedure: DILATATION & CURETTAGE/HYSTEROSCOPY WITH attempted HYDROTHERMAL ABLATION;  Surgeon: Kathreen Cosier, MD;  Location: [facility];  Service: Gynecology;  Laterality: N/A;   LASIK     TONSILLECTOMY     TUBAL LIGATION     Patient Active Problem List   Diagnosis Date Noted   Spondylolisthesis of lumbar region 06/03/2022   Ankle instability 06/28/2021   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 Hx of chronic LBP   SUBJECTIVE:    SUBJECTIVE STATEMENT: Pt reports she is feeling good today, she is not experiencing LBP. Pt notes she has the most difficulty at night c pain, but the gabapentin helps.   PERTINENT HISTORY: Neck pain, Cervical surgery, PTSD, HAs   PAIN:  Are you having pain? Yes: NPRS scale: 0/10  Pain location: low back Pain description: pressure  Aggravating factors: prolonged sitting (20 min), standing and walking (30 mins standing and walking) Relieving factors: changing positions, lying down Pain range on 4-9/10   PRECAUTIONS: Back 5# lifting restriction; and No BLT as per pt. Spoke with Dr. Val Riles office and the pt's lifting and movement restrictions have been lifted.   WEIGHT BEARING RESTRICTIONS: No   FALLS:  Has patient fallen in last 6 months? 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   Fluoroscopy time: 54 seconds.   Cumulative air Kerma 38.13 mGy   Images: 2   EXAM: LUMBAR SPINE - 2-3 VIEW   COMPARISON:  None Available.   FINDINGS: Imaging was provided during placement of pedicle rods and screws at L4, L5, and S1. The  right-sided L4 screw distal tip appears to extend just above the superior endplate. Hardware is otherwise in good position. Disc spacer devices are noted.   IMPRESSION: The right L4 screw distal tip appears to extend just above the superior endplate. Hardware is otherwise in good position.     On: 06/03/2022 12:38      PATIENT SURVEYS:  FOTO: Perceived function   47%, predicted   54%    COGNITION: Overall cognitive status: Within functional limits for tasks assessed                         SENSATION: WFL   EDEMA:             None observed    MUSCLE LENGTH: Hamstrings: Right  50 deg; Left 45 deg Thomas test: Right NT deg; Left NT deg   POSTURE: rounded shoulders and forward head   PALPATION: TTP R low back paraspinals and QL with increase muscle tension   LOWER EXTREMITY MMT: Decreased core strength MMT ROM Right eval Left eval  Hip flexion 3+ 3+  Hip extension 3 3  Hip abduction 3 3  Hip adduction      Hip internal rotation      Hip external rotation 3+ 3+  Knee flexion 4+ 4+  Knee extension 4+ 4+  Ankle dorsiflexion 4 4  Ankle plantarflexion      Ankle inversion      Ankle eversion       (Blank rows = not tested)   LOWER EXTREMITY ROM: Grossly WNLs ROM Right eval Left eval  Hip flexion      Hip extension      Hip abduction      Hip adduction      Hip internal rotation      Hip external rotation      Knee flexion      Knee extension      Ankle dorsiflexion      Ankle plantarflexion      Ankle inversion      Ankle eversion       (Blank rows = not tested)   Lumbar ROM: NT due to pt reported mobility restrictions   LOWER EXTREMITY SPECIAL TESTS:  NT   FUNCTIONAL TESTS:  5 times sit to stand: 08/22/22: 51 seconds with UE standard mat 2 minute walk test: 323 feet 08/22/22 SLS: TBA   GAIT: Distance walked: 232ft Assistive device utilized: None Level of assistance: Complete Independence Comments: Decreased pace     TODAY'S TREATMENT [facility]  Adult PT Treatment:  DATE: 10/27/22 Therapeutic Exercise: Nustep L5 6 mins UE/LE Single leg stands, 30' each Sustained bridge 60" Sustained plank from knees 60", from toes 50" Shoulder row omega 2x10 20# Lat pull down 2x10 20#  [facility] Adult PT Treatment:                                                DATE: 10/25/22 Therapeutic Exercise: Nustep L5 6 mins UE/LE 5xSTS x2 Cybex leg press 2x10 40# Wall cat/camel and L stretch Wall stretch lateral and lateral L stretch Shoulder row omega 2x10 20# Lat pull down 2x10 20# Marching on airex mat 2x10 each leg  [facility] Adult PT Treatment:                                                DATE: 10/20/22 Therapeutic Exercise: Nustep L5 6 mins UE/LE Hip flexor stretch in standing x1 20" bilat SKTC x1 20" each DKTC x1 20" LTR with cues to keep comfortable x5 Seated trunk flexion forward and laterally STS x10 Shoulder row x15 GTB Shoulder ext x 15 GTB Paloff press x15 single GTB Knee ext omega 2x10 15# Standing hip abd 2x10   PATIENT EDUCATION:  Education details: Eval findings, POC, HEP, self care  Person educated: Patient Education method: Explanation, Demonstration, Tactile cues, Verbal cues, and Handouts Education comprehension: verbalized understanding, returned demonstration, verbal cues required, and tactile cues required   HOME EXERCISE PROGRAM: Access Code: [email or web address] URL: [email or web address] Date: 08/24/2022 Prepared by: Joellyn Rued  Exercises - Standing Shoulder Row with Anchored Resistance  - 1-2 x daily - 7 x weekly - 2-3 sets - 10 reps - Shoulder extension with resistance - Neutral  - 1-2 x daily - 7 x weekly - 2-3 sets - 10 reps - Mini Squat with Counter Support  - 1-2 x daily - 7 x weekly - 2-3 sets - 10 reps - Supine Posterior Pelvic Tilt  - 1-2 x daily - 7 x weekly - 2-3 sets - 10 reps - 3 hold - Supine Bridge  - 1-2 x daily - 7 x weekly - 1 sets - 5-10 reps -  3 hold - Hooklying Single Knee to Chest  - 1-2 x daily - 7 x weekly - 1 sets - 3 reps - 20 hold - Seated Flexion Stretch with Swiss Ball  - 1-2 x daily - 7 x weekly - 1 sets - 5-10 reps - 5-20 hold - Supine Lower Trunk Rotation  - 1 x daily - 7 x weekly - 1 sets - 5-10 reps - 10 hold - Modified Thomas Stretch  - 1 x daily - 7 x weekly - 1 sets - 2-3 reps - 30 hold - Standing Hip Flexor Stretch  - 1 x daily - 7 x weekly - 1 sets - 2-3 reps - 30 hold    ASSESSMENT:   CLINICAL IMPRESSION: PT was completed for lumbopelvic flexibility and strengthening. Reassessed which was found to be significantly improved. Assess single leg standing and pt was able to complete at a high level of 30" each LE. Core strength was also assessed with pt demonstrating good stability. Pt is making good progress re: strength, lumbopelvic stability, and function. Pt will continue to  benefit from skilled PT to address impairments for improved function.  OBJECTIVE IMPAIRMENTS: decreased activity tolerance, decreased balance, decreased mobility, difficulty walking, decreased ROM, increased muscle spasms, impaired flexibility, and pain.    ACTIVITY LIMITATIONS: carrying, lifting, bending, sitting, standing, squatting, sleeping, bathing, toileting, dressing, reach over head, locomotion level, and caring for others   PARTICIPATION LIMITATIONS: meal prep, cleaning, laundry, driving, community activity, and occupation   PERSONAL FACTORS: Fitness, Past/current experiences, Profession, and Time since onset of injury/illness/exacerbation are also affecting patient's functional outcome.    REHAB POTENTIAL: Good   CLINICAL DECISION MAKING: Evolving/moderate complexity   EVALUATION COMPLEXITY: Moderate     GOALS:   SHORT TERM GOALS: Target date: 09/02/22 Pt will be Ind in an initial HEP  Baseline: initiated Goal status: MET 09/20/22   LONG TERM GOALS: Target date: 12/09/22   Pt will be Ind in a final HEP to maintain  achieved LOF  Baseline: 10/07/22 Goal status: Ongoing   2.  Improve 5xSTS by MCID of 5" and by MCID of 73ft as indication of improved functional mobility  Baseline: see functional tests Status: 10/25/22: 5xSTS=22.1 s use of hands Status: 5/30/24525ft Goal status: MET for 5xSTS; MET for   3.  Improve SLS to greater than 15" as demonstration of improved stability/balance  Baseline: TBA Status: 10/27/22=30" each Goal status: MET   4.  Improve pt's core strength as demonstrated by maintaining a bridge for 60" and a plank from her knees for 30" Baseline: TBA Status: bridging 60", Plank from toes 50" Goal status: Ongoing   5.  Improve pt's bilat hip strength for 4+/5 for improved stability and function Baseline: see flow sheets Goal status: Ongoing   6.  Pt will report a decrease in LBP and L LE pain to 5/10 or for improved function and QOL Baseline: 4-9/10 Goal status: Ongoing    PLAN:   PT FREQUENCY: 2x/week   PT DURATION: other: 6 weeks   PLANNED INTERVENTIONS: Therapeutic exercises, Therapeutic activity, Balance training, Gait training, Patient/Family education, Self Care, Joint mobilization, Stair training, Aquatic Therapy, Dry Needling, Electrical stimulation, Spinal mobilization, Cryotherapy, Moist heat, Taping, Ionotophoresis 4mg /ml Dexamethasone, Manual therapy, and Re-evaluation   PLAN FOR NEXT SESSION: Review FOTO; assess response to HEP; progress therex as indicated; use of modalities, manual therapy; and TPDN as indicated.  Trayvion Embleton MS, PT 10/27/22 10:03 PM

## 2022-11-01 ENCOUNTER — Ambulatory Visit: Payer: No Typology Code available for payment source | Attending: Neurosurgery

## 2022-11-01 DIAGNOSIS — R262 Difficulty in walking, not elsewhere classified: Secondary | ICD-10-CM | POA: Diagnosis present

## 2022-11-01 DIAGNOSIS — R208 Other disturbances of skin sensation: Secondary | ICD-10-CM | POA: Diagnosis present

## 2022-11-01 DIAGNOSIS — M5459 Other low back pain: Secondary | ICD-10-CM | POA: Diagnosis present

## 2022-11-01 DIAGNOSIS — M6281 Muscle weakness (generalized): Secondary | ICD-10-CM | POA: Diagnosis present

## 2022-11-01 NOTE — Therapy (Signed)
OUTPATIENT PHYSICAL THERAPY TREATMENT NOTE   Patient Name: Abigail Reynolds MRN: [id number] DOB:05/22/1976, 47 y.o., female Today's Date: 11/01/2022  PCP: [facility]   REFERRING PROVIDER: Lisbeth Renshaw, MD   Progress Note Reporting Period 08/10/22 to 09/3022  See note below for Objective Data and Assessment of Progress/Goals.      END OF SESSION:   PT End of Session - 11/01/22 1417     Visit Number 11    Number of Visits 19    Date for PT Re-Evaluation 12/09/22    Authorization Type [place]    Authorization Time Period 08/31/2022 - 03/04/2023    Authorization - Visit Number 11    Authorization - Number of Visits 19    Progress Note Due on Visit 29    PT Start Time 1417    PT Stop Time 1500    PT Time Calculation (min) 43 min    Activity Tolerance Patient tolerated treatment well    Behavior During Therapy WFL for tasks assessed/performed                  Past Medical History:  Diagnosis Date   Arthritis    Depression    GERD (gastroesophageal reflux disease)    Headache    PTSD (post-traumatic stress disorder)    Seizures (HCC)    FOCAL SEIZURES  NO IN 2 YEARS "last sezuire over 5 years ago" per pt   Past Surgical History:  Procedure Laterality Date   BACK SURGERY     L4 AND L5 2014   CERVICAL SPINE SURGERY     C 4 AND C5   COLONOSCOPY     DILATION AND CURETTAGE OF UTERUS     DILITATION & CURRETTAGE/HYSTROSCOPY WITH HYDROTHERMAL ABLATION N/A 01/01/2014   Procedure: DILATATION & CURETTAGE/HYSTEROSCOPY WITH attempted HYDROTHERMAL ABLATION;  Surgeon: Kathreen Cosier, MD;  Location: [facility];  Service: Gynecology;  Laterality: N/A;   LASIK     TONSILLECTOMY     TUBAL LIGATION     Patient Active Problem List   Diagnosis Date Noted   Spondylolisthesis of lumbar region 06/03/2022   Ankle instability 06/28/2021   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 Hx of chronic LBP   SUBJECTIVE:    SUBJECTIVE STATEMENT: Pt reports she wakes up stiff/discomfort in the Am and it improves over approx a half hour   PERTINENT HISTORY: Neck pain, Cervical surgery, PTSD, HAs   PAIN:  Are you having pain? Yes: NPRS scale: 2/10  Pain location: low back Pain description: pressure  Aggravating factors: prolonged sitting (20 min), standing and walking (30 mins standing and walking) Relieving factors: changing positions, lying down Pain range on 4-9/10   PRECAUTIONS: Back 5# lifting restriction; and No BLT as per pt. Spoke with Dr. Val Riles office and the pt's lifting and movement restrictions have been lifted.   WEIGHT BEARING RESTRICTIONS: No   FALLS:  Has patient fallen in last 6 months? 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   Fluoroscopy time: 54 seconds.   Cumulative air Kerma 38.13 mGy   Images: 2   EXAM: LUMBAR SPINE - 2-3 VIEW   COMPARISON:  None Available.   FINDINGS: Imaging was provided during placement of pedicle rods and screws at L4, L5, and S1. The right-sided L4 screw distal tip appears to extend just above the superior  endplate. Hardware is otherwise in good position. Disc spacer devices are noted.   IMPRESSION: The right L4 screw distal tip appears to extend just above the superior endplate. Hardware is otherwise in good position.     On: 06/03/2022 12:38      PATIENT SURVEYS:  FOTO: Perceived function   47%, predicted   54%    COGNITION: Overall cognitive status: Within functional limits for tasks assessed                         SENSATION: WFL   EDEMA:             None observed    MUSCLE LENGTH: Hamstrings: Right  50 deg; Left 45 deg Thomas test: Right NT deg; Left NT deg   POSTURE: rounded shoulders and forward head   PALPATION: TTP R low back paraspinals and QL with increase muscle tension   LOWER EXTREMITY MMT: Decreased core strength MMT ROM Right eval Left eval  Hip flexion 3+ 3+  Hip extension 3 3  Hip abduction 3 3  Hip adduction      Hip internal rotation      Hip external rotation 3+ 3+  Knee flexion 4+ 4+  Knee extension 4+ 4+  Ankle dorsiflexion 4 4  Ankle plantarflexion      Ankle inversion      Ankle eversion       (Blank rows = not tested)   LOWER EXTREMITY ROM: Grossly WNLs ROM Right eval Left eval  Hip flexion      Hip extension      Hip abduction      Hip adduction      Hip internal rotation      Hip external rotation      Knee flexion      Knee extension      Ankle dorsiflexion      Ankle plantarflexion      Ankle inversion      Ankle eversion       (Blank rows = not tested)   Lumbar ROM: NT due to pt reported mobility restrictions   LOWER EXTREMITY SPECIAL TESTS:  NT   FUNCTIONAL TESTS:  5 times sit to stand: 08/22/22: 51 seconds with UE standard mat 2 minute walk test: 323 feet 08/22/22 SLS: TBA   GAIT: Distance walked: 265ft Assistive device utilized: None Level of assistance: Complete Independence Comments: Decreased pace     TODAY'S TREATMENT [facility] Adult PT Treatment:                                                DATE:  11/01/22 Therapeutic Exercise: Nustep L5  6 mins UE/LE Hamstring stretch Piriformis stretch Seated trunk flexion forward and laterally c swiss ball Bridging x10 SL bridge x4, small range f/b 3 reps of staggered bridges  90/90 abdominal bracing x5 10" S/L clams  x10  [facility] Adult PT Treatment:                                                DATE: 10/27/22 Therapeutic Exercise: Nustep L5 6 mins UE/LE Single leg stands, 30' each Sustained bridge 60" Sustained plank from knees 60", from toes 50" Shoulder row omega 2x10 20# Lat pull down 2x10 20#  [facility] Adult PT Treatment:                                                DATE: 10/25/22 Therapeutic Exercise: Nustep L5 6 mins UE/LE 5xSTS x2 Cybex leg press 2x10 40# Wall cat/camel and L stretch Wall stretch lateral and lateral L stretch Shoulder row omega 2x10 20# Lat pull down 2x10 20# Marching on airex mat 2x10 each leg  [facility] Adult PT Treatment:                                                DATE: 10/20/22 Therapeutic Exercise: Nustep L5 6 mins UE/LE Hip flexor stretch in standing x1 20" bilat SKTC x1 20" each DKTC x1 20" LTR with cues to keep comfortable x5 Seated trunk flexion forward and laterally STS x10 Shoulder row x15 GTB Shoulder ext x 15 GTB Paloff press x15 single GTB Knee ext omega 2x10 15# Standing hip abd 2x10   PATIENT EDUCATION:  Education details: Eval findings, POC, HEP, self care  Person educated: Patient Education method: Explanation, Demonstration, Tactile cues, Verbal cues, and Handouts Education comprehension: verbalized understanding, returned demonstration, verbal cues required, and tactile cues required   HOME EXERCISE PROGRAM: Access Code: [email or web address] URL: [email or web address] Date: 08/24/2022 Prepared by: Joellyn Rued  Exercises - Standing Shoulder Row with Anchored Resistance  - 1-2 x daily - 7 x weekly - 2-3 sets - 10 reps - Shoulder extension with resistance - Neutral  - 1-2 x  daily - 7 x weekly - 2-3 sets - 10 reps - Mini Squat with Counter Support  - 1-2 x daily - 7 x weekly - 2-3 sets - 10 reps - Supine Posterior Pelvic Tilt  - 1-2 x daily - 7 x weekly - 2-3 sets - 10 reps - 3 hold - Supine Bridge  - 1-2 x daily - 7 x weekly - 1 sets - 5-10 reps - 3 hold - Hooklying Single Knee to Chest  - 1-2 x daily - 7 x weekly - 1 sets - 3 reps - 20 hold - Seated Flexion Stretch with Swiss Ball  - 1-2 x daily - 7 x weekly - 1 sets - 5-10 reps - 5-20 hold - Supine Lower Trunk Rotation  - 1 x daily - 7 x weekly - 1 sets - 5-10 reps - 10 hold - Modified Thomas Stretch  - 1 x daily - 7 x weekly - 1 sets - 2-3 reps - 30 hold -  Standing Hip Flexor Stretch  - 1 x daily - 7 x weekly - 1 sets - 2-3 reps - 30 hold    ASSESSMENT:   CLINICAL IMPRESSION: PT was completed for lumbopelvic flexibility and strengthening. Therex for hip ext was progressed to single leg bridge. Pt was only able complete in a small range. A few attempts of a staggered bridges were attempted which the pt was able to complete in a greater range. Will continue to work on trunk and hip extension strength. Pt tolerated PT today without adverse effects.  OBJECTIVE IMPAIRMENTS: decreased activity tolerance, decreased balance, decreased mobility, difficulty walking, decreased ROM, increased muscle spasms, impaired flexibility, and pain.    ACTIVITY LIMITATIONS: carrying, lifting, bending, sitting, standing, squatting, sleeping, bathing, toileting, dressing, reach over head, locomotion level, and caring for others   PARTICIPATION LIMITATIONS: meal prep, cleaning, laundry, driving, community activity, and occupation   PERSONAL FACTORS: Fitness, Past/current experiences, Profession, and Time since onset of injury/illness/exacerbation are also affecting patient's functional outcome.    REHAB POTENTIAL: Good   CLINICAL DECISION MAKING: Evolving/moderate complexity   EVALUATION COMPLEXITY: Moderate     GOALS:    SHORT TERM GOALS: Target date: 09/02/22 Pt will be Ind in an initial HEP  Baseline: initiated Goal status: MET 09/20/22   LONG TERM GOALS: Target date: 12/09/22   Pt will be Ind in a final HEP to maintain achieved LOF  Baseline: 10/07/22 Goal status: Ongoing   2.  Improve 5xSTS by MCID of 5" and by MCID of 32ft as indication of improved functional mobility  Baseline: see functional tests Status: 10/25/22: 5xSTS=22.1 s use of hands Status: 5/30/24525ft Goal status: MET for 5xSTS; MET for   3.  Improve SLS to greater than 15" as demonstration of improved stability/balance  Baseline: TBA Status: 10/27/22=30" each Goal status: MET   4.  Improve pt's core strength as demonstrated by maintaining a bridge for 60" and a plank from her knees for 30" Baseline: TBA Status: bridging 60", Plank from toes 50" Goal status: Ongoing   5.  Improve pt's bilat hip strength for 4+/5 for improved stability and function Baseline: see flow sheets Goal status: Ongoing   6.  Pt will report a decrease in LBP and L LE pain to 5/10 or for improved function and QOL Baseline: 4-9/10 Goal status: Ongoing    PLAN:   PT FREQUENCY: 2x/week   PT DURATION: other: 6 weeks   PLANNED INTERVENTIONS: Therapeutic exercises, Therapeutic activity, Balance training, Gait training, Patient/Family education, Self Care, Joint mobilization, Stair training, Aquatic Therapy, Dry Needling, Electrical stimulation, Spinal mobilization, Cryotherapy, Moist heat, Taping, Ionotophoresis 4mg /ml Dexamethasone, Manual therapy, and Re-evaluation   PLAN FOR NEXT SESSION: Review FOTO; assess response to HEP; progress therex as indicated; use of modalities, manual therapy; and TPDN as indicated.  Martinez Boxx MS, PT 11/01/22 5:51 PM

## 2022-11-02 NOTE — Therapy (Signed)
OUTPATIENT PHYSICAL THERAPY TREATMENT NOTE   Patient Name: Abigail Reynolds MRN: [id number] DOB:05/28/1976, 47 y.o., female Today's Date: 11/03/2022  [facility]: [facility]   REFERRING PROVIDER: Lisbeth Renshaw, MD   See note below for Objective Data and Assessment of Progress/Goals.      END OF SESSION:   PT End of Session - 11/03/22 1427     Visit Number 12    Number of Visits 19    Authorization Type [place]    Authorization Time Period 08/31/2022 - 03/04/2023    Authorization - Visit Number 12    Authorization - Number of Visits 19    PT Start Time 1423    PT Stop Time 1502    PT Time Calculation (min) 39 min    Activity Tolerance Patient tolerated treatment well    Behavior During Therapy WFL for tasks assessed/performed                  Past Medical History:  Diagnosis Date   Arthritis    Depression    GERD (gastroesophageal reflux disease)    Headache    PTSD (post-traumatic stress disorder)    Seizures (HCC)    FOCAL SEIZURES  NO IN 2 YEARS "last sezuire over 5 years ago" per pt   Past Surgical History:  Procedure Laterality Date   BACK SURGERY     L4 AND L5 2014   CERVICAL SPINE SURGERY     C 4 AND C5   COLONOSCOPY     DILATION AND CURETTAGE OF UTERUS     DILITATION & CURRETTAGE/HYSTROSCOPY WITH HYDROTHERMAL ABLATION N/A 01/01/2014   Procedure: DILATATION & CURETTAGE/HYSTEROSCOPY WITH attempted HYDROTHERMAL ABLATION;  Surgeon: Kathreen Cosier, MD;  Location: [facility];  Service: Gynecology;  Laterality: N/A;   LASIK     TONSILLECTOMY     TUBAL LIGATION     Patient Active Problem List   Diagnosis Date Noted   Spondylolisthesis of lumbar region 06/03/2022   Ankle instability 06/28/2021   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 Hx of chronic LBP   SUBJECTIVE:    SUBJECTIVE STATEMENT: Pt reports caring for a friend's 58 month old baby and her shoulder's and buttock are sore   PERTINENT HISTORY: Neck pain, Cervical surgery, PTSD, HAs   PAIN:  Are you having pain? Yes: NPRS scale: 2/10  Pain location: low back Pain description: pressure  Aggravating factors: prolonged sitting (20 min), standing and walking (30 mins standing and walking) Relieving factors: changing positions, lying down Pain range on 4-9/10   PRECAUTIONS: Back 5# lifting restriction; and No BLT as per pt. Spoke with Dr. Val Riles office and the pt's lifting and movement restrictions have been lifted.   WEIGHT BEARING RESTRICTIONS: No   FALLS:  Has patient fallen in last 6 months? 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   Fluoroscopy time: 54 seconds.   Cumulative air Kerma 38.13 mGy   Images: 2   EXAM: LUMBAR SPINE - 2-3 VIEW   COMPARISON:  None Available.   FINDINGS: Imaging was provided during placement of pedicle rods and screws at L4, L5, and S1. The right-sided L4 screw distal tip appears to extend just above the superior endplate. Hardware is otherwise in good position. Disc spacer devices are noted.   IMPRESSION: The right L4 screw distal tip appears  to extend just above the superior endplate. Hardware is otherwise in good position.     On: 06/03/2022 12:38      PATIENT SURVEYS:  FOTO: Perceived function   47%, predicted   54%    COGNITION: Overall cognitive status: Within functional limits for tasks assessed                         SENSATION: WFL   EDEMA:             None observed    MUSCLE LENGTH: Hamstrings: Right  50 deg; Left 45 deg Thomas test: Right NT deg; Left NT deg   POSTURE: rounded shoulders and forward head   PALPATION: TTP R low back paraspinals and QL with increase muscle tension   LOWER EXTREMITY MMT: Decreased core strength MMT ROM Right eval Left eval Rt LT  Hip flexion 3+ 3+ 4 4  Hip extension 3 3 3+ 3+  Hip abduction 3 3 4+ 4+  Hip adduction        Hip internal rotation        Hip external rotation 3+ 3+ 4+ 4+  Knee flexion 4+ 4+    Knee extension 4+ 4+    Ankle dorsiflexion 4 4    Ankle plantarflexion        Ankle inversion        Ankle eversion         (Blank rows = not tested)   LOWER EXTREMITY ROM: Grossly WNLs ROM Right eval Left eval  Hip flexion      Hip extension      Hip abduction      Hip adduction      Hip internal rotation      Hip external rotation      Knee flexion      Knee extension      Ankle dorsiflexion      Ankle plantarflexion      Ankle inversion      Ankle eversion       (Blank rows = not tested)   Lumbar ROM: NT due to pt reported mobility restrictions   LOWER EXTREMITY SPECIAL TESTS:  NT   FUNCTIONAL TESTS:  5 times sit to stand: 08/22/22: 51 seconds with UE standard mat 2 minute walk test: 323 feet 08/22/22 SLS: TBA   GAIT: Distance walked: 26ft Assistive device utilized: None Level of assistance: Complete Independence Comments: Decreased pace     TODAY'S TREATMENT [facility] Adult PT Treatment:                                                DATE: 11/03/22 Therapeutic Exercise: Nustep L5  6 mins UE/LE Palloff press 2x10 Prone hip ext x10  each Prone LE/UE 2x5 each Forward plank x5 10" Staggered bridges x5 each  [facility] Adult PT Treatment:                                                DATE: 11/01/22 Therapeutic Exercise: Nustep L5 6 mins UE/LE Hamstring stretch Piriformis stretch Seated trunk flexion forward and laterally c swiss ball Bridging x10 SL bridge x4, small range f/b 3 reps of staggered bridges  90/90 abdominal bracing x5 10" S/L clams  x10  [facility] Adult PT Treatment:                                                DATE: 10/27/22 Therapeutic Exercise: Nustep L5 6 mins UE/LE Single leg stands, 30' each Sustained bridge 60" Sustained plank from knees 60", from toes 50" Shoulder row omega 2x10 20# Lat pull down 2x10 20#  [facility] Adult PT Treatment:                                                DATE: 10/25/22 Therapeutic Exercise: Nustep L5 6 mins UE/LE 5xSTS x2 Cybex leg press 2x10 40# Wall cat/camel and L stretch Wall stretch lateral and lateral L stretch Shoulder row omega 2x10 20# Lat pull down 2x10 20# Marching on airex mat 2x10 each leg  [facility] Adult PT Treatment:                                                DATE: 10/20/22 Therapeutic Exercise: Nustep L5 6 mins UE/LE Hip flexor stretch in standing x1 20" bilat SKTC x1 20" each DKTC x1 20" LTR with cues to keep comfortable x5 Seated trunk flexion forward and laterally STS x10 Shoulder row x15 GTB Shoulder ext x 15 GTB Paloff press x15 single GTB Knee ext omega 2x10 15# Standing hip abd 2x10   PATIENT EDUCATION:  Education details: Eval findings, POC, HEP, self care  Person educated: Patient Education method: Explanation, Demonstration, Tactile cues, Verbal cues, and Handouts Education comprehension: verbalized understanding, returned demonstration, verbal cues required, and tactile cues required   HOME EXERCISE PROGRAM: Access Code: [email or web address] URL: [email or web address] Date: 08/24/2022 Prepared by: Joellyn Rued  Exercises - Standing Shoulder Row with Anchored Resistance  - 1-2 x daily - 7 x weekly - 2-3 sets - 10 reps - Shoulder extension with resistance - Neutral  - 1-2 x daily - 7 x weekly - 2-3 sets - 10 reps - Mini Squat with Counter Support  - 1-2 x daily - 7 x weekly - 2-3 sets - 10 reps - Supine Posterior Pelvic Tilt  - 1-2 x daily - 7 x weekly - 2-3 sets - 10 reps - 3 hold - Supine Bridge  - 1-2 x daily - 7 x weekly - 1 sets - 5-10 reps - 3 hold - Hooklying Single Knee to Chest  - 1-2 x daily -  7 x weekly - 1 sets - 3 reps - 20 hold - Seated Flexion Stretch with Swiss Ball  - 1-2 x daily - 7 x weekly - 1 sets - 5-10 reps - 5-20 hold - Supine Lower Trunk Rotation  - 1 x daily - 7 x weekly - 1 sets - 5-10 reps - 10 hold - Modified Thomas Stretch  - 1 x daily - 7 x weekly - 1 sets - 2-3 reps - 30 hold - Standing Hip Flexor Stretch  - 1 x daily - 7 x weekly - 1 sets - 2-3 reps - 30 hold    ASSESSMENT:   CLINICAL IMPRESSION: Reassessed hip strength which was found to be improved bilat. PT was completed for lumbopelvic/gluteal and core strengthening. Pt's hip extensors are weaker than the hip muscles. Pt tolerated prescribed therex without adverse effects. Pt cotinues to make appropriate progress re: pain and function. Will continue to progress strengthening as tolerated for improved trunk function with less pain.  OBJECTIVE IMPAIRMENTS: decreased activity tolerance, decreased balance, decreased mobility, difficulty walking, decreased ROM, increased muscle spasms, impaired flexibility, and pain.    ACTIVITY LIMITATIONS: carrying, lifting, bending, sitting, standing, squatting, sleeping, bathing, toileting, dressing, reach over head, locomotion level, and caring for others   PARTICIPATION LIMITATIONS: meal prep, cleaning, laundry, driving, community activity, and occupation   PERSONAL FACTORS: Fitness, Past/current experiences, Profession, and Time since onset of injury/illness/exacerbation  are also affecting patient's functional outcome.    REHAB POTENTIAL: Good   CLINICAL DECISION MAKING: Evolving/moderate complexity   EVALUATION COMPLEXITY: Moderate     GOALS:   SHORT TERM GOALS: Target date: 09/02/22 Pt will be Ind in an initial HEP  Baseline: initiated Goal status: MET 09/20/22   LONG TERM GOALS: Target date: 12/09/22   Pt will be Ind in a final HEP to maintain achieved LOF  Baseline: 10/07/22 Goal status: Ongoing   2.  Improve 5xSTS by MCID of 5" and by MCID of 1ft as indication of improved functional mobility  Baseline: see functional tests Status: 10/25/22: 5xSTS=22.1 s use of hands Status: 5/30/24525ft Goal status: MET for 5xSTS; MET for   3.  Improve SLS to greater than 15" as demonstration of improved stability/balance  Baseline: TBA Status: 10/27/22=30" each Goal status: MET   4.  Improve pt's core strength as demonstrated by maintaining a bridge for 60" and a plank from her knees for 30" Baseline: TBA Status: bridging 60", Plank from toes 50" Goal status: Ongoing   5.  Improve pt's bilat hip strength for 4+/5 for improved stability and function Baseline: see flow sheets Goal status: Ongoing   6.  Pt will report a decrease in LBP and L LE pain to 5/10 or for improved function and QOL Baseline: 4-9/10 Goal status: Ongoing    PLAN:   PT FREQUENCY: 2x/week   PT DURATION: other: 6 weeks   PLANNED INTERVENTIONS: Therapeutic exercises, Therapeutic activity, Balance training, Gait training, Patient/Family education, Self Care, Joint mobilization, Stair training, Aquatic Therapy, Dry Needling, Electrical stimulation, Spinal mobilization, Cryotherapy, Moist heat, Taping, Ionotophoresis 4mg /ml Dexamethasone, Manual therapy, and Re-evaluation   PLAN FOR NEXT SESSION: Review FOTO; assess response to HEP; progress therex as indicated; use of modalities, manual therapy; and TPDN as indicated.  Wandalee Klang MS, PT 11/03/22 6:09 PM

## 2022-11-03 ENCOUNTER — Ambulatory Visit: Payer: No Typology Code available for payment source

## 2022-11-03 DIAGNOSIS — M5459 Other low back pain: Secondary | ICD-10-CM

## 2022-11-03 DIAGNOSIS — R262 Difficulty in walking, not elsewhere classified: Secondary | ICD-10-CM

## 2022-11-03 DIAGNOSIS — M6281 Muscle weakness (generalized): Secondary | ICD-10-CM

## 2022-11-07 NOTE — Therapy (Signed)
OUTPATIENT PHYSICAL THERAPY TREATMENT NOTE   Patient Name: Abigail Reynolds MRN: [id number] DOB:[birth year], 47 y.o., female Today's Date: 11/08/2022  PCP: [facility]   REFERRING PROVIDER: Lisbeth Renshaw, MD   See note below for Objective Data and Assessment of Progress/Goals.      END OF SESSION:   PT End of Session - 11/08/22 1552     Visit Number 13    Number of Visits 19    Date for PT Re-Evaluation 12/09/22    Authorization Type [place]    Authorization Time Period 08/31/2022 - 03/04/2023    Authorization - Visit Number 13    Authorization - Number of Visits 19    Progress Note Due on Visit 29    PT Start Time 1549    PT Stop Time 1632    PT Time Calculation (min) 43 min    Activity Tolerance Patient tolerated treatment well    Behavior During Therapy WFL for tasks assessed/performed                   Past Medical History:  Diagnosis Date   Arthritis    Depression    GERD (gastroesophageal reflux disease)    Headache    PTSD (post-traumatic stress disorder)    Seizures (HCC)    FOCAL SEIZURES  NO IN 2 YEARS "last sezuire over 5 years ago" per pt   Past Surgical History:  Procedure Laterality Date   BACK SURGERY     L4 AND L5 2014   CERVICAL SPINE SURGERY     C 4 AND C5   COLONOSCOPY     DILATION AND CURETTAGE OF UTERUS     DILITATION & CURRETTAGE/HYSTROSCOPY WITH HYDROTHERMAL ABLATION N/A 01/01/2014   Procedure: DILATATION & CURETTAGE/HYSTEROSCOPY WITH attempted HYDROTHERMAL ABLATION;  Surgeon: Kathreen Cosier, MD;  Location: [facility];  Service: Gynecology;  Laterality: N/A;   LASIK     TONSILLECTOMY     TUBAL LIGATION     Patient Active Problem List   Diagnosis Date Noted   Spondylolisthesis of lumbar region 06/03/2022   Ankle instability 06/28/2021   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 Hx of chronic LBP   SUBJECTIVE:    SUBJECTIVE STATEMENT: Pt reports her R low back has been huting her more. Pt notes her L ankle and L neck are also bothering her as well. Pt is not sure why she is hurting more. Pt states she is having difficulty bending forward. She notes she is stretching which provides temporary relief.   PERTINENT HISTORY: Neck pain, Cervical surgery, PTSD, HAs   PAIN:  Are you having pain? Yes: NPRS scale: 5/10  Pain location: low back Pain description: pressure  Aggravating factors: prolonged sitting (20 min), standing and walking (30 mins standing and walking) Relieving factors: changing positions, lying down Pain range on 4-9/10   PRECAUTIONS: Back 5# lifting restriction; and No BLT as per pt. Spoke with Dr. Val Riles office and the pt's lifting and movement restrictions have been lifted.   WEIGHT BEARING RESTRICTIONS: No   FALLS:  Has patient fallen in last 6 months? 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   Fluoroscopy time: 54 seconds.   Cumulative air Kerma 38.13 mGy   Images: 2   EXAM: LUMBAR SPINE - 2-3 VIEW   COMPARISON:  None Available.    FINDINGS: Imaging was provided during placement of pedicle rods and screws at L4, L5, and S1. The right-sided L4 screw distal tip appears to extend just above the superior endplate. Hardware is otherwise in good position. Disc spacer devices are noted.   IMPRESSION: The right L4 screw distal tip appears to extend just above the superior endplate. Hardware is otherwise in good position.     On: 06/03/2022 12:38      PATIENT SURVEYS:  FOTO: Perceived function   47%, predicted   54%    COGNITION: Overall cognitive status: Within functional limits for tasks assessed                         SENSATION: WFL   EDEMA:             None observed    MUSCLE LENGTH: Hamstrings: Right  50 deg; Left 45 deg Thomas test: Right NT deg; Left NT deg   POSTURE: rounded shoulders and forward head   PALPATION: TTP R low back paraspinals and QL with increase muscle tension   LOWER EXTREMITY MMT: Decreased core strength MMT ROM Right eval Left eval Rt LT  Hip flexion 3+ 3+ 4 4  Hip extension 3 3 3+ 3+  Hip abduction 3 3 4+ 4+  Hip adduction        Hip internal rotation        Hip external rotation 3+ 3+ 4+ 4+  Knee flexion 4+ 4+    Knee extension 4+ 4+    Ankle dorsiflexion 4 4    Ankle plantarflexion        Ankle inversion        Ankle eversion         (Blank rows = not tested)   LOWER EXTREMITY ROM: Grossly WNLs ROM Right eval Left eval  Hip flexion      Hip extension      Hip abduction      Hip adduction      Hip internal rotation      Hip external rotation      Knee flexion      Knee extension      Ankle dorsiflexion      Ankle plantarflexion      Ankle inversion      Ankle eversion       (Blank rows = not tested)   Lumbar ROM: NT due to pt reported mobility restrictions   LOWER EXTREMITY SPECIAL TESTS:  NT   FUNCTIONAL TESTS:  5 times sit to stand: 08/22/22: 51 seconds with UE standard mat 2 minute walk test: 323 feet 08/22/22 SLS: TBA   GAIT: Distance  walked: 274ft Assistive device utilized: None Level of assistance: Complete Independence Comments: Decreased pace  TODAY'S TREATMENT [facility] Adult PT Treatment:                                                DATE: 11/08/22 Therapeutic Exercise: Nustep L5 7 mins UE/LE SKTC each Piriformis stretch each Seated trunk flexion forward and laterally c swiss ball L stretch at sink Manual Therapy: STM/DTM to the  R paraspinals and QL Self Care: Use of moist heat, TENs, and stretching therex to address R low back pain and tightness  [facility] Adult PT Treatment:                                                DATE: 11/03/22 Therapeutic Exercise: Nustep L5 6 mins UE/LE Palloff press 2x10 Prone hip ext x10 each Prone LE/UE 2x5 each Forward plank x5 10" Staggered bridges x5 each  [facility] Adult PT Treatment:                                                DATE: 11/01/22 Therapeutic Exercise: Nustep L5 6 mins UE/LE Hamstring stretch Piriformis stretch Seated trunk flexion forward and laterally c swiss ball Bridging x10 SL bridge x4, small range f/b 3 reps of staggered bridges  90/90 abdominal bracing x5 10" S/L clams  x10  [facility] Adult PT Treatment:                                                DATE: 10/27/22 Therapeutic Exercise: Nustep L5 6 mins UE/LE Single leg stands, 30' each Sustained bridge 60" Sustained plank from knees 60", from toes 50" Shoulder row omega 2x10 20# Lat pull down 2x10 20#   PATIENT EDUCATION:  Education details: Eval findings, POC, HEP, self care  Person educated: Patient Education method: Explanation, Demonstration, Tactile cues, Verbal cues, and Handouts Education comprehension: verbalized understanding, returned demonstration, verbal cues required, and tactile cues required   HOME EXERCISE PROGRAM: Access Code: [email or web address] URL: [email or web address] Date: 08/24/2022 Prepared by: Joellyn Rued  Exercises - Standing Shoulder Row with Anchored  Resistance  - 1-2 x daily - 7 x weekly - 2-3 sets - 10 reps - Shoulder extension with resistance - Neutral  - 1-2 x daily - 7 x weekly - 2-3 sets - 10 reps - Mini Squat with Counter Support  - 1-2 x daily - 7 x weekly - 2-3 sets - 10 reps - Supine Posterior Pelvic Tilt  - 1-2 x daily - 7 x weekly - 2-3 sets - 10 reps - 3 hold - Supine Bridge  - 1-2 x daily - 7 x weekly - 1 sets - 5-10 reps - 3 hold - Hooklying Single Knee to Chest  - 1-2 x daily - 7 x weekly - 1 sets - 3 reps - 20 hold - Seated Flexion Stretch with Swiss Ball  - 1-2 x daily - 7 x weekly - 1 sets - 5-10 reps - 5-20 hold - Supine Lower Trunk Rotation  - 1 x  daily - 7 x weekly - 1 sets - 5-10 reps - 10 hold - Modified Thomas Stretch  - 1 x daily - 7 x weekly - 1 sets - 2-3 reps - 30 hold - Standing Hip Flexor Stretch  - 1 x daily - 7 x weekly - 1 sets - 2-3 reps - 30 hold    ASSESSMENT:   CLINICAL IMPRESSION: Pt presents to PT with increased R low back pain. Palpation revealed increased muscle tightness and tenderness of the R paraspinals and QL. PT was provided for STM/DTM to the R lumbar paraspinals and QL f/b the pt completing flexibility therex for the area. Following the session, the pt reported improvement in the R LBP. Discussed use of moist heat and TENs to address the increased muscle tension and pain at home. Pt tolerated PT today without adverse effects.   OBJECTIVE IMPAIRMENTS: decreased activity tolerance, decreased balance, decreased mobility, difficulty walking, decreased ROM, increased muscle spasms, impaired flexibility, and pain.    ACTIVITY LIMITATIONS: carrying, lifting, bending, sitting, standing, squatting, sleeping, bathing, toileting, dressing, reach over head, locomotion level, and caring for others   PARTICIPATION LIMITATIONS: meal prep, cleaning, laundry, driving, community activity, and occupation   PERSONAL FACTORS: Fitness, Past/current experiences, Profession, and Time since onset of  injury/illness/exacerbation are also affecting patient's functional outcome.    REHAB POTENTIAL: Good   CLINICAL DECISION MAKING: Evolving/moderate complexity   EVALUATION COMPLEXITY: Moderate     GOALS:   SHORT TERM GOALS: Target date: 09/02/22 Pt will be Ind in an initial HEP  Baseline: initiated Goal status: MET 09/20/22   LONG TERM GOALS: Target date: 12/09/22   Pt will be Ind in a final HEP to maintain achieved LOF  Baseline: 10/07/22 Goal status: Ongoing   2.  Improve 5xSTS by MCID of 5" and by MCID of 36ft as indication of improved functional mobility  Baseline: see functional tests Status: 10/25/22: 5xSTS=22.1 s use of hands Status: 5/30/24525ft Goal status: MET for 5xSTS; MET for   3.  Improve SLS to greater than 15" as demonstration of improved stability/balance  Baseline: TBA Status: 10/27/22=30" each Goal status: MET   4.  Improve pt's core strength as demonstrated by maintaining a bridge for 60" and a plank from her knees for 30" Baseline: TBA Status: bridging 60", Plank from toes 50" Goal status: Ongoing   5.  Improve pt's bilat hip strength for 4+/5 for improved stability and function Baseline: see flow sheets Goal status: Ongoing   6.  Pt will report a decrease in LBP and L LE pain to 5/10 or for improved function and QOL Baseline: 4-9/10 Goal status: Ongoing    PLAN:   PT FREQUENCY: 2x/week   PT DURATION: other: 6 weeks   PLANNED INTERVENTIONS: Therapeutic exercises, Therapeutic activity, Balance training, Gait training, Patient/Family education, Self Care, Joint mobilization, Stair training, Aquatic Therapy, Dry Needling, Electrical stimulation, Spinal mobilization, Cryotherapy, Moist heat, Taping, Ionotophoresis 4mg /ml Dexamethasone, Manual therapy, and Re-evaluation   PLAN FOR NEXT SESSION: Review FOTO; assess response to HEP; progress therex as indicated; use of modalities, manual therapy; and TPDN as indicated.  Nikole Swartzentruber MS,  PT 11/08/22 5:46 PM

## 2022-11-08 ENCOUNTER — Ambulatory Visit: Payer: No Typology Code available for payment source

## 2022-11-08 DIAGNOSIS — R262 Difficulty in walking, not elsewhere classified: Secondary | ICD-10-CM

## 2022-11-08 DIAGNOSIS — M6281 Muscle weakness (generalized): Secondary | ICD-10-CM

## 2022-11-08 DIAGNOSIS — M5459 Other low back pain: Secondary | ICD-10-CM | POA: Diagnosis not present

## 2022-11-08 DIAGNOSIS — R208 Other disturbances of skin sensation: Secondary | ICD-10-CM

## 2022-11-09 NOTE — Therapy (Signed)
OUTPATIENT PHYSICAL THERAPY TREATMENT NOTE   Patient Name: Abigail Reynolds MRN: [id number] DOB:04-Jul-1975, 47 y.o., female Today's Date: 11/11/2022  [facility]: [facility]   REFERRING PROVIDER: Lisbeth Renshaw, MD   See note below for Objective Data and Assessment of Progress/Goals.      END OF SESSION:   PT End of Session - 11/10/22 1427     Visit Number 13    Number of Visits 19    Date for PT Re-Evaluation 12/09/22    Authorization Type [place]    Authorization Time Period 08/31/2022 - 03/04/2023    Authorization - Visit Number 13    Authorization - Number of Visits 19    PT Start Time 1422    PT Stop Time 1500    PT Time Calculation (min) 38 min    Activity Tolerance Patient tolerated treatment well    Behavior During Therapy WFL for tasks assessed/performed                    Past Medical History:  Diagnosis Date   Arthritis    Depression    GERD (gastroesophageal reflux disease)    Headache    PTSD (post-traumatic stress disorder)    Seizures (HCC)    FOCAL SEIZURES  NO IN 2 YEARS "last sezuire over 5 years ago" per pt   Past Surgical History:  Procedure Laterality Date   BACK SURGERY     L4 AND L5 2014   CERVICAL SPINE SURGERY     C 4 AND C5   COLONOSCOPY     DILATION AND CURETTAGE OF UTERUS     DILITATION & CURRETTAGE/HYSTROSCOPY WITH HYDROTHERMAL ABLATION N/A 01/01/2014   Procedure: DILATATION & CURETTAGE/HYSTEROSCOPY WITH attempted HYDROTHERMAL ABLATION;  Surgeon: Kathreen Cosier, MD;  Location: [facility];  Service: Gynecology;  Laterality: N/A;   LASIK     TONSILLECTOMY     TUBAL LIGATION     Patient Active Problem List   Diagnosis Date Noted   Spondylolisthesis of lumbar region 06/03/2022   Ankle instability 06/28/2021   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 Hx of chronic LBP   SUBJECTIVE:    SUBJECTIVE STATEMENT: Pt reports her R low back is feeling better returning its usual level of pain.   PERTINENT HISTORY: Neck pain, Cervical surgery, PTSD, HAs   PAIN:  Are you having pain? Yes: NPRS scale: 3/10  Pain location: low back Pain description: pressure  Aggravating factors: prolonged sitting (20 min), standing and walking (30 mins standing and walking) Relieving factors: changing positions, lying down Pain range on eval 4-9/10   PRECAUTIONS: Back 5# lifting restriction; and No BLT as per pt. Spoke with Dr. Val Riles office and the pt's lifting and movement restrictions have been lifted.   WEIGHT BEARING RESTRICTIONS: No   FALLS:  Has patient fallen in last 6 months? 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   Fluoroscopy time: 54 seconds.   Cumulative air Kerma 38.13 mGy   Images: 2   EXAM: LUMBAR SPINE - 2-3 VIEW   COMPARISON:  None Available.   FINDINGS: Imaging was provided during placement of pedicle rods and screws at L4, L5, and S1. The right-sided L4 screw distal tip appears to extend just above the superior endplate. Hardware is otherwise in good position. Disc  spacer devices are noted.   IMPRESSION: The right L4 screw distal tip appears to extend just above the superior endplate. Hardware is otherwise in good position.     On: 06/03/2022 12:38      PATIENT SURVEYS:  FOTO: Perceived function   47%, predicted   54%    COGNITION: Overall cognitive status: Within functional limits for tasks assessed                         SENSATION: WFL   EDEMA:             None observed    MUSCLE LENGTH: Hamstrings: Right  50 deg; Left 45 deg Thomas test: Right NT deg; Left NT deg   POSTURE: rounded shoulders and forward head   PALPATION: TTP R low back paraspinals and QL with increase muscle tension   LOWER EXTREMITY MMT: Decreased core strength MMT ROM Right eval Left eval Rt 11/03/22 LT 11/03/22  Hip flexion 3+ 3+ 4 4  Hip extension 3 3 3+ 3+  Hip abduction 3 3 4+ 4+  Hip adduction        Hip internal rotation        Hip external rotation 3+ 3+ 4+ 4+  Knee flexion 4+ 4+    Knee extension 4+ 4+    Ankle dorsiflexion 4 4    Ankle plantarflexion        Ankle inversion        Ankle eversion         (Blank rows = not tested)   LOWER EXTREMITY ROM: Grossly WNLs ROM Right eval Left eval  Hip flexion      Hip extension      Hip abduction      Hip adduction      Hip internal rotation      Hip external rotation      Knee flexion      Knee extension      Ankle dorsiflexion      Ankle plantarflexion      Ankle inversion      Ankle eversion       (Blank rows = not tested)   Lumbar ROM: NT due to pt reported mobility restrictions   LOWER EXTREMITY SPECIAL TESTS:  NT   FUNCTIONAL TESTS:  5 times sit to stand: 08/22/22: 51 seconds with UE standard mat 2 minute walk test: 323 feet 08/22/22 SLS: TBA   GAIT: Distance walked: 266ft Assistive device utilized: None Level of assistance: Complete Independence Comments: Decreased pace     TODAY'S TREATMENT [facility] Adult PT Treatment:  DATE: 11/10/22 Therapeutic Exercise: Nustep L5 6 mins UE/LE Shoulder row 2x10 GTB Shoulder ext 2x20 GTB Hinged hip sit to stand from bari-mat 2x10 10# Side steps c GTB 34ft x2 Seated trunk flexion forward and laterally c swiss ball Standing QL stretch Manual Therapy: STM/DTM to the  R paraspinals and QL Self Care: Use of moist heat, TENs, and stretching therex to address R low back pain and tightness  [facility] Adult PT Treatment:                                                DATE: 11/08/22 Therapeutic Exercise: Nustep L5 7 mins UE/LE SKTC each Piriformis stretch each Seated trunk flexion forward and laterally c swiss ball L stretch at sink Manual Therapy: STM/DTM to the  R paraspinals and QL Self Care: Use of moist heat, TENs, and stretching therex to address R low back pain and tightness  [facility] Adult PT Treatment:                                                DATE: 11/03/22 Therapeutic Exercise: Nustep L5 6 mins UE/LE Palloff press 2x10 Prone hip ext x10 each Prone LE/UE 2x5 each Forward plank x5 10" Staggered bridges x5 each  [facility] Adult PT Treatment:                                                DATE: 11/01/22 Therapeutic Exercise: Nustep L5 6 mins UE/LE Hamstring stretch Piriformis stretch Seated trunk flexion forward and laterally c swiss ball Bridging x10 SL bridge x4, small range f/b 3 reps of staggered bridges  90/90 abdominal bracing x5 10" S/L clams  x10  [facility] Adult PT Treatment:                                                DATE: 10/27/22 Therapeutic Exercise: Nustep L5 6 mins UE/LE Single leg stands, 30' each Sustained bridge 60" Sustained plank from knees 60", from toes 50" Shoulder row omega 2x10 20# Lat pull down 2x10 20#   PATIENT EDUCATION:  Education details: Eval findings, POC, HEP, self care  Person educated: Patient Education method: Explanation, Demonstration, Tactile cues, Verbal cues, and Handouts Education comprehension: verbalized  understanding, returned demonstration, verbal cues required, and tactile cues required   HOME EXERCISE PROGRAM: Access Code: [email or web address] URL: [email or web address] Date: 08/24/2022 Prepared by: Joellyn Rued  Exercises - Standing Shoulder Row with Anchored Resistance  - 1-2 x daily - 7 x weekly - 2-3 sets - 10 reps - Shoulder extension with resistance - Neutral  - 1-2 x daily - 7 x weekly - 2-3 sets - 10 reps - Mini Squat with Counter Support  - 1-2 x daily - 7 x weekly - 2-3 sets - 10 reps - Supine Posterior Pelvic Tilt  - 1-2 x daily - 7 x weekly - 2-3 sets - 10 reps - 3 hold - Supine Bridge  - 1-2 x daily -  7 x weekly - 1 sets - 5-10 reps - 3 hold - Hooklying Single Knee to Chest  - 1-2 x daily - 7 x weekly - 1 sets - 3 reps - 20 hold - Seated Flexion Stretch with Swiss Ball  - 1-2 x daily - 7 x weekly - 1 sets - 5-10 reps - 5-20 hold - Supine Lower Trunk Rotation  - 1 x daily - 7 x weekly - 1 sets - 5-10 reps - 10 hold - Modified Thomas Stretch  - 1 x daily - 7 x weekly - 1 sets - 2-3 reps - 30 hold - Standing Hip Flexor Stretch  - 1 x daily - 7 x weekly - 1 sets - 2-3 reps - 30 hold    ASSESSMENT:   CLINICAL IMPRESSION: Pt returns to PT with her R LBP improved following an increase. Pt found the seated trunk flexion c swiss ball vs the standing QL to be the most effective stretch to address her R LBP and tightness. PT was continued to address lumbopelvic flexibility as well as for strength and stability. Overall, pt is is making appropriate progress re: pain, strength, and function. Pt will continue to benefit from skilled PT to address impairments for improved function with less pain.  OBJECTIVE IMPAIRMENTS: decreased activity tolerance, decreased balance, decreased mobility, difficulty walking, decreased ROM, increased muscle spasms, impaired flexibility, and pain.    ACTIVITY LIMITATIONS: carrying, lifting, bending, sitting, standing, squatting, sleeping, bathing,  toileting, dressing, reach over head, locomotion level, and caring for others   PARTICIPATION LIMITATIONS: meal prep, cleaning, laundry, driving, community activity, and occupation   PERSONAL FACTORS: Fitness, Past/current experiences, Profession, and Time since onset of injury/illness/exacerbation are also affecting patient's functional outcome.    REHAB POTENTIAL: Good   CLINICAL DECISION MAKING: Evolving/moderate complexity   EVALUATION COMPLEXITY: Moderate     GOALS:   SHORT TERM GOALS: Target date: 09/02/22 Pt will be Ind in an initial HEP  Baseline: initiated Goal status: MET 09/20/22   LONG TERM GOALS: Target date: 12/09/22   Pt will be Ind in a final HEP to maintain achieved LOF  Baseline: 10/07/22 Goal status: Ongoing   2.  Improve 5xSTS by MCID of 5" and by MCID of 40ft as indication of improved functional mobility  Baseline: see functional tests Status: 10/25/22: 5xSTS=22.1 s use of hands Status: 10/27/22=544ft Goal status: MET for 5xSTS; MET for   3.  Improve SLS to greater than 15" as demonstration of improved stability/balance  Baseline: TBA Status: 10/27/22=30" each Goal status: MET   4.  Improve pt's core strength as demonstrated by maintaining a bridge for 60" and a plank from her knees for 30" Baseline: TBA Status: bridging 60", Plank from toes 50" Goal status: Improving   5.  Improve pt's bilat hip strength for 4+/5 for improved stability and function Baseline: see flow sheets Status:11/03/22= see flow sheets Goal status: Improved   6.  Pt will report a decrease in LBP and L LE pain to 5/10 or for improved function and QOL Baseline: 4-9/10 Goal status: Ongoing    PLAN:   PT FREQUENCY: 2x/week   PT DURATION: other: 6 weeks   PLANNED INTERVENTIONS: Therapeutic exercises, Therapeutic activity, Balance training, Gait training, Patient/Family education, Self Care, Joint mobilization, Stair training, Aquatic Therapy, Dry Needling, Electrical  stimulation, Spinal mobilization, Cryotherapy, Moist heat, Taping, Ionotophoresis 4mg /ml Dexamethasone, Manual therapy, and Re-evaluation   PLAN FOR NEXT SESSION: Review FOTO; assess response to HEP; progress therex  as indicated; use of modalities, manual therapy; and TPDN as indicated.  Waynette Towers MS, PT 11/11/22 6:16 AM

## 2022-11-10 ENCOUNTER — Ambulatory Visit: Payer: No Typology Code available for payment source

## 2022-11-10 DIAGNOSIS — M5459 Other low back pain: Secondary | ICD-10-CM

## 2022-11-10 DIAGNOSIS — R262 Difficulty in walking, not elsewhere classified: Secondary | ICD-10-CM

## 2022-11-10 DIAGNOSIS — M6281 Muscle weakness (generalized): Secondary | ICD-10-CM

## 2022-11-10 DIAGNOSIS — R208 Other disturbances of skin sensation: Secondary | ICD-10-CM

## 2022-11-16 ENCOUNTER — Encounter: Payer: Self-pay | Admitting: Physical Therapy

## 2022-11-16 ENCOUNTER — Ambulatory Visit: Payer: No Typology Code available for payment source | Admitting: Physical Therapy

## 2022-11-16 DIAGNOSIS — M5459 Other low back pain: Secondary | ICD-10-CM

## 2022-11-16 NOTE — Therapy (Signed)
OUTPATIENT PHYSICAL THERAPY TREATMENT NOTE   Patient Name: Abigail Reynolds MRN: [id number] DOB:April 24, 1976, 47 y.o., female Today's Date: 11/16/2022  PCP: [facility]   REFERRING PROVIDER: Lisbeth Renshaw, MD   See note below for Objective Data and Assessment of Progress/Goals.      END OF SESSION:   PT End of Session - 11/16/22 1407     Visit Number 14    Number of Visits 19    Date for PT Re-Evaluation 12/09/22    Authorization Type [place]    Authorization Time Period 08/31/2022 - 03/04/2023    Authorization - Visit Number 14    Authorization - Number of Visits 19    Progress Note Due on Visit 29    PT Start Time 1405    PT Stop Time 1445    PT Time Calculation (min) 40 min                    Past Medical History:  Diagnosis Date   Arthritis    Depression    GERD (gastroesophageal reflux disease)    Headache    PTSD (post-traumatic stress disorder)    Seizures (HCC)    FOCAL SEIZURES  NO IN 2 YEARS "last sezuire over 5 years ago" per pt   Past Surgical History:  Procedure Laterality Date   BACK SURGERY     L4 AND L5 2014   CERVICAL SPINE SURGERY     C 4 AND C5   COLONOSCOPY     DILATION AND CURETTAGE OF UTERUS     DILITATION & CURRETTAGE/HYSTROSCOPY WITH HYDROTHERMAL ABLATION N/A 01/01/2014   Procedure: DILATATION & CURETTAGE/HYSTEROSCOPY WITH attempted HYDROTHERMAL ABLATION;  Surgeon: Kathreen Cosier, MD;  Location: [facility];  Service: Gynecology;  Laterality: N/A;   LASIK     TONSILLECTOMY     TUBAL LIGATION     Patient Active Problem List   Diagnosis Date Noted   Spondylolisthesis of lumbar region 06/03/2022   Ankle instability 06/28/2021   Hypermobility syndrome 06/28/2021   Patellofemoral arthritis 06/28/2021    REFERRING DIAG: M43.16 (ICD-10-CM) - Spondylolisthesis, lumbar region   THERAPY DIAG:  Other low back pain  Rationale for Evaluation and Treatment Rehabilitation  ONSET DATE: POSTERIOR LUMBAR  INTERBODY FUSION LUMBAR THREE-FOUR/LUMBAR FOUR-FIVE 06/03/22. Hx of chronic LBP   SUBJECTIVE:    SUBJECTIVE STATEMENT: My back is way better. I have one stretch that I do all the time and it helps.    PERTINENT HISTORY: Neck pain, Cervical surgery, PTSD, HAs   PAIN:  Are you having pain? Yes: NPRS scale: 2/10  Pain location: low back, right Pain description: pressure  Aggravating factors: prolonged sitting (20 min), standing and walking (30 mins standing and walking) Relieving factors: changing positions, lying down Pain range on eval 2-9/10   PRECAUTIONS: Back 5# lifting restriction; and No BLT as per pt. Spoke with Dr. Val Riles office and the pt's lifting and movement restrictions have been lifted.   WEIGHT BEARING RESTRICTIONS: No   FALLS:  Has patient fallen in last 6 months? 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   Fluoroscopy time: 54 seconds.   Cumulative air Kerma 38.13 mGy   Images: 2   EXAM: LUMBAR SPINE - 2-3 VIEW   COMPARISON:  None Available.   FINDINGS: Imaging was provided during placement of pedicle rods and screws at L4, L5, and S1. The right-sided L4 screw distal tip appears to extend just above the superior endplate. Hardware is otherwise in good position. Disc spacer devices are noted.   IMPRESSION: The right L4 screw distal tip appears to extend just above the superior endplate. Hardware is otherwise in good position.     On: 06/03/2022  12:38      PATIENT SURVEYS:  FOTO: Perceived function   47%, predicted   54%    COGNITION: Overall cognitive status: Within functional limits for tasks assessed                         SENSATION: WFL   EDEMA:             None observed    MUSCLE LENGTH: Hamstrings: Right  50 deg; Left 45 deg Thomas test: Right NT deg; Left NT deg   POSTURE: rounded shoulders and forward head   PALPATION: TTP R low back paraspinals and QL with increase muscle tension   LOWER EXTREMITY MMT: Decreased core strength MMT ROM Right eval Left eval Rt 11/03/22 LT 11/03/22  Hip flexion 3+ 3+ 4 4  Hip extension 3 3 3+ 3+  Hip abduction 3 3 4+ 4+  Hip adduction        Hip internal rotation        Hip external rotation 3+ 3+ 4+ 4+  Knee flexion 4+ 4+    Knee extension 4+ 4+    Ankle dorsiflexion 4 4    Ankle plantarflexion        Ankle inversion        Ankle eversion         (Blank rows = not tested)   LOWER EXTREMITY ROM: Grossly WNLs ROM Right eval Left eval  Hip flexion      Hip extension      Hip abduction      Hip adduction      Hip internal rotation      Hip external rotation      Knee flexion      Knee extension      Ankle dorsiflexion      Ankle plantarflexion      Ankle inversion      Ankle eversion       (Blank rows = not tested)   Lumbar ROM: NT due to pt reported mobility restrictions   LOWER EXTREMITY SPECIAL TESTS:  NT   FUNCTIONAL TESTS:  5 times sit to stand: 08/22/22: 51 seconds with UE standard mat 2 minute walk test: 323 feet 08/22/22 SLS: TBA   GAIT: Distance walked: 247ft Assistive device utilized: None Level of assistance: Complete Independence Comments: Decreased pace     TODAY'S TREATMENT   [facility] Adult PT Treatment:                                                DATE: 11/16/22 Therapeutic Exercise: Lora Paula  L5 6 mins UE/LE Seated trunk flexion Standing Row Standing shoulder ext  Palloff press double green band x 12 each   Therapeutic  Activity: Hip hinge with wooden dowel x 10 standing and seated STS focusing on mechanics , not using back of legs.    [facility] Adult PT Treatment:                                                DATE: 11/10/22 Therapeutic Exercise: Nustep L5 6 mins UE/LE Shoulder row 2x10 GTB Shoulder ext 2x20 GTB Hinged hip sit to stand from bari-mat 2x10 10# Side steps c GTB 53ft x2 Seated trunk flexion forward and laterally c swiss ball Standing QL stretch Manual Therapy: STM/DTM to the  R paraspinals and QL Self Care: Use of moist heat, TENs, and stretching therex to address R low back pain and tightness  [facility] Adult PT Treatment:                                                DATE: 11/08/22 Therapeutic Exercise: Nustep L5 7 mins UE/LE SKTC each Piriformis stretch each Seated trunk flexion forward and laterally c swiss ball L stretch at sink Manual Therapy: STM/DTM to the  R paraspinals and QL Self Care: Use of moist heat, TENs, and stretching therex to address R low back pain and tightness  [facility] Adult PT Treatment:                                                DATE: 11/03/22 Therapeutic Exercise: Nustep L5 6 mins UE/LE Palloff press 2x10 Prone hip ext x10 each Prone LE/UE 2x5 each Forward plank x5 10" Staggered bridges x5 each  [facility] Adult PT Treatment:                                                DATE: 11/01/22 Therapeutic Exercise: Nustep L5 6 mins UE/LE Hamstring stretch Piriformis stretch Seated trunk flexion forward and laterally c swiss ball Bridging x10 SL bridge x4, small range f/b 3 reps of staggered bridges  90/90 abdominal bracing x5 10" S/L clams  x10  [facility] Adult PT Treatment:                                                DATE: 10/27/22 Therapeutic Exercise: Nustep L5 6 mins UE/LE Single leg stands, 30' each Sustained bridge 60" Sustained plank from knees 60", from toes 50" Shoulder row omega 2x10 20# Lat pull down 2x10 20#   PATIENT EDUCATION:  Education  details: Eval findings, POC, HEP, self care  Person educated: Patient Education method: Explanation, Demonstration, Tactile cues, Verbal cues, and Handouts Education comprehension: verbalized understanding, returned demonstration, verbal cues required, and tactile cues required   HOME EXERCISE PROGRAM: Access Code: [email or web address] URL: [email or web address] Date: 08/24/2022 Prepared by:  Allen Ralls  Exercises - Standing Shoulder Row with Anchored Resistance  - 1-2 x daily - 7 x weekly - 2-3 sets - 10 reps - Shoulder extension with resistance - Neutral  - 1-2 x daily - 7 x weekly - 2-3 sets - 10 reps - Mini Squat with Counter Support  - 1-2 x daily - 7 x weekly - 2-3 sets - 10 reps - Supine Posterior Pelvic Tilt  - 1-2 x daily - 7 x weekly - 2-3 sets - 10 reps - 3 hold - Supine Bridge  - 1-2 x daily - 7 x weekly - 1 sets - 5-10 reps - 3 hold - Hooklying Single Knee to Chest  - 1-2 x daily - 7 x weekly - 1 sets - 3 reps - 20 hold - Seated Flexion Stretch with Swiss Ball  - 1-2 x daily - 7 x weekly - 1 sets - 5-10 reps - 5-20 hold - Supine Lower Trunk Rotation  - 1 x daily - 7 x weekly - 1 sets - 5-10 reps - 10 hold - Modified Thomas Stretch  - 1 x daily - 7 x weekly - 1 sets - 2-3 reps - 30 hold - Standing Hip Flexor Stretch  - 1 x daily - 7 x weekly - 1 sets - 2-3 reps - 30 hold    ASSESSMENT:   CLINICAL IMPRESSION: Pt reports overall improvement in back pain since beginning PT. She has been bending over today to tend to her animals so has some mild back pain on arrival. She moves with hesitancy during sit<->stand and this was addressed today with hip hinge education and control of descent. Pt tend to use back of knees on chair when sitting. Pt able to improved mechanics in clinic however will need reinforcement for carryover.  Pt will continue to benefit from skilled PT to address impairments for improved function with less pain.  OBJECTIVE IMPAIRMENTS: decreased activity  tolerance, decreased balance, decreased mobility, difficulty walking, decreased ROM, increased muscle spasms, impaired flexibility, and pain.    ACTIVITY LIMITATIONS: carrying, lifting, bending, sitting, standing, squatting, sleeping, bathing, toileting, dressing, reach over head, locomotion level, and caring for others   PARTICIPATION LIMITATIONS: meal prep, cleaning, laundry, driving, community activity, and occupation   PERSONAL FACTORS: Fitness, Past/current experiences, Profession, and Time since onset of injury/illness/exacerbation are also affecting patient's functional outcome.    REHAB POTENTIAL: Good   CLINICAL DECISION MAKING: Evolving/moderate complexity   EVALUATION COMPLEXITY: Moderate     GOALS:   SHORT TERM GOALS: Target date: 09/02/22 Pt will be Ind in an initial HEP  Baseline: initiated Goal status: MET 09/20/22   LONG TERM GOALS: Target date: 12/09/22   Pt will be Ind in a final HEP to maintain achieved LOF  Baseline: 10/07/22 Goal status: Ongoing   2.  Improve 5xSTS by MCID of 5" and by MCID of 61ft as indication of improved functional mobility  Baseline: see functional tests Status: 10/25/22: 5xSTS=22.1 s use of hands Status: 10/27/22=565ft Goal status: MET for 5xSTS; MET for   3.  Improve SLS to greater than 15" as demonstration of improved stability/balance  Baseline: TBA Status: 10/27/22=30" each Goal status: MET   4.  Improve pt's core strength as demonstrated by maintaining a bridge for 60" and a plank from her knees for 30" Baseline: TBA Status: bridging 60", Plank from toes 50" Goal status: Improving   5.  Improve pt's bilat hip strength for 4+/5 for improved stability and  function Baseline: see flow sheets Status:11/03/22= see flow sheets Goal status: Improved   6.  Pt will report a decrease in LBP and L LE pain to 5/10 or for improved function and QOL Baseline: 4-9/10 11/16/22: 2-9/10 Goal status: Ongoing    PLAN:   PT FREQUENCY:  2x/week   PT DURATION: other: 6 weeks   PLANNED INTERVENTIONS: Therapeutic exercises, Therapeutic activity, Balance training, Gait training, Patient/Family education, Self Care, Joint mobilization, Stair training, Aquatic Therapy, Dry Needling, Electrical stimulation, Spinal mobilization, Cryotherapy, Moist heat, Taping, Ionotophoresis 4mg /ml Dexamethasone, Manual therapy, and Re-evaluation   PLAN FOR NEXT SESSION: Review FOTO; assess response to HEP; progress therex as indicated; use of modalities, manual therapy; and TPDN as indicated. Hip hinge/ STS with control   Jannette Spanner, PTA 11/16/22 3:39 PM Phone: [phone] Fax: [phone]

## 2022-11-22 ENCOUNTER — Ambulatory Visit: Payer: No Typology Code available for payment source

## 2022-11-23 NOTE — Therapy (Signed)
OUTPATIENT PHYSICAL THERAPY TREATMENT NOTE   Patient Name: Abigail Reynolds MRN: [id number] DOB:1975-07-18, 47 y.o., female Today's Date: 11/24/2022  [facility]   REFERRING PROVIDER: Lisbeth Renshaw, MD   See note below for Objective Data and Assessment of Progress/Goals.      END OF SESSION:   PT End of Session - 11/24/22 1422     Visit Number 15    Number of Visits 19    Date for PT Re-Evaluation 12/09/22    Authorization Type [place]    Authorization Time Period 08/31/2022 - 03/04/2023    Authorization - Visit Number 15    Authorization - Number of Visits 19    Progress Note Due on Visit 20    PT Start Time 1416    PT Stop Time 1500    PT Time Calculation (min) 44 min    Activity Tolerance Patient tolerated treatment well    Behavior During Therapy WFL for tasks assessed/performed                     Past Medical History:  Diagnosis Date   Arthritis    Depression    GERD (gastroesophageal reflux disease)    Headache    PTSD (post-traumatic stress disorder)    Seizures (HCC)    FOCAL SEIZURES  NO IN 2 YEARS "last sezuire over 5 years ago" per pt   Past Surgical History:  Procedure Laterality Date   BACK SURGERY     L4 AND L5 2014   CERVICAL SPINE SURGERY     C 4 AND C5   COLONOSCOPY     DILATION AND CURETTAGE OF UTERUS     DILITATION & CURRETTAGE/HYSTROSCOPY WITH HYDROTHERMAL ABLATION N/A 01/01/2014   Procedure: DILATATION & CURETTAGE/HYSTEROSCOPY WITH attempted HYDROTHERMAL ABLATION;  Surgeon: Kathreen Cosier, MD;  Location: [facility];  Service: Gynecology;  Laterality: N/A;   LASIK     TONSILLECTOMY     TUBAL LIGATION     Patient Active Problem List   Diagnosis Date Noted   Spondylolisthesis of lumbar region 06/03/2022   Ankle instability 06/28/2021   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 Hx of chronic LBP   SUBJECTIVE:    SUBJECTIVE STATEMENT: When my back is bothering me, the stretches can really help. Overall, function and pain are better.   PERTINENT HISTORY: Neck pain, Cervical surgery, PTSD, HAs   PAIN:  Are you having pain? Yes: NPRS scale: 0/10  Pain location: low back, right Pain description: pressure  Aggravating factors: prolonged sitting (20 min), standing and walking (30 mins standing and walking) Relieving factors: changing positions, lying down Pain range on eval 2-9/10   PRECAUTIONS: Back 5# lifting restriction; and No BLT as per pt. Spoke with Dr. Val Riles office and the pt's lifting and movement restrictions have been lifted.   WEIGHT BEARING RESTRICTIONS: No   FALLS:  Has patient fallen in last 6 months? 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   Fluoroscopy time: 54 seconds.   Cumulative air Kerma 38.13 mGy   Images: 2   EXAM: LUMBAR SPINE - 2-3 VIEW   COMPARISON:  None Available.   FINDINGS: Imaging was provided during placement of pedicle rods and screws at L4, L5, and S1. The right-sided L4 screw distal tip appears to extend just above the  superior endplate. Hardware is otherwise in good position. Disc spacer devices are noted.   IMPRESSION: The right L4 screw distal tip appears to extend just above the superior endplate. Hardware is otherwise in good position.     On: 06/03/2022 12:38      PATIENT SURVEYS:  FOTO: Perceived function   47%, predicted   54%. 11/24/22=56%   COGNITION: Overall cognitive status: Within functional limits for tasks assessed                         SENSATION: WFL   EDEMA:             None observed    MUSCLE LENGTH: Hamstrings: Right  50 deg; Left 45 deg Thomas test: Right NT deg; Left NT deg   POSTURE: rounded shoulders and forward head   PALPATION: TTP R low back paraspinals and QL with increase muscle tension   LOWER EXTREMITY MMT: Decreased core strength MMT ROM Right eval Left eval Rt 11/03/22 LT 11/03/22  Hip flexion 3+ 3+ 4 4  Hip extension 3 3 3+ 3+  Hip abduction 3 3 4+ 4+  Hip adduction        Hip internal rotation        Hip external rotation 3+ 3+ 4+ 4+  Knee flexion 4+ 4+    Knee extension 4+ 4+    Ankle dorsiflexion 4 4    Ankle plantarflexion        Ankle inversion        Ankle eversion         (Blank rows = not tested)   LOWER EXTREMITY ROM: Grossly WNLs ROM Right eval Left eval  Hip flexion      Hip extension      Hip abduction      Hip adduction      Hip internal rotation      Hip external rotation      Knee flexion      Knee extension      Ankle dorsiflexion      Ankle plantarflexion      Ankle inversion      Ankle eversion       (Blank rows = not tested)   Lumbar ROM: NT due to pt reported mobility restrictions   LOWER EXTREMITY SPECIAL TESTS:  NT   FUNCTIONAL TESTS:  5 times sit to stand: 08/22/22: 51 seconds with UE standard mat 2 minute walk test: 323 feet 08/22/22 SLS: TBA   GAIT: Distance walked: 268ft Assistive device utilized: None Level of assistance: Complete Independence Comments: Decreased pace     TODAY'S  TREATMENT [facility] Adult PT Treatment:  DATE: 11/24/22 Therapeutic Exercise: Nustep L5 6 mins UE/LE Seated trunk flexion Standing Row Standing shoulder ext 2x10 GTB Palloff side steps c press double RTB x 12 each   Therapeutic Activity: Reassessed FOTO with review of results, expected value met Hip hinge with wooden dowel x 10 standing and seated Hip hinge sit to stand with 10 lbs Marching on airex Standing balance on airex, narrow stance, ABC c heavy yellow ball  [facility] Adult PT Treatment:                                                DATE: 11/16/22 Therapeutic Exercise: Nustep L5 6 mins UE/LE Seated trunk flexion Standing shoulder ext Palloff press double green band x 12 each   Therapeutic Activity: Hip hinge with wooden dowel x 10 standing and seated STS focusing on mechanics , not using back of legs.  [facility] Adult PT Treatment:                                                DATE: 11/10/22 Therapeutic Exercise: Nustep L5 6 mins UE/LE Shoulder row 2x10 GTB Shoulder ext 2x20 GTB Hinged hip sit to stand from bari-mat 2x10 10# Side steps c GTB 77ft x2 Seated trunk flexion forward and laterally c swiss ball Standing QL stretch Manual Therapy: STM/DTM to the  R paraspinals and QL Self Care: Use of moist heat, TENs, and stretching therex to address R low back pain and tightness  [facility] Adult PT Treatment:                                                DATE: 11/08/22 Therapeutic Exercise: Nustep L5 7 mins UE/LE SKTC each Piriformis stretch each Seated trunk flexion forward and laterally c swiss ball L stretch at sink Manual Therapy: STM/DTM to the  R paraspinals and QL Self Care: Use of moist heat, TENs, and stretching therex to address R low back pain and tightness   PATIENT EDUCATION:  Education details: Eval findings, POC, HEP, self care  Person educated: Patient Education method: Explanation, Demonstration, Tactile cues, Verbal  cues, and Handouts Education comprehension: verbalized understanding, returned demonstration, verbal cues required, and tactile cues required   HOME EXERCISE PROGRAM: Access Code: [email or web address] URL: [email or web address] Date: 08/24/2022 Prepared by: Joellyn Rued  Exercises - Standing Shoulder Row with Anchored Resistance  - 1-2 x daily - 7 x weekly - 2-3 sets - 10 reps - Shoulder extension with resistance - Neutral  - 1-2 x daily - 7 x weekly - 2-3 sets - 10 reps - Mini Squat with Counter Support  - 1-2 x daily - 7 x weekly - 2-3 sets - 10 reps - Supine Posterior Pelvic Tilt  - 1-2 x daily - 7 x weekly - 2-3 sets - 10 reps - 3 hold - Supine Bridge  - 1-2 x daily - 7 x weekly - 1 sets - 5-10 reps - 3 hold - Hooklying Single Knee to Chest  - 1-2 x daily - 7 x weekly - 1 sets - 3 reps - 20 hold - Seated Flexion Stretch  with [employer]  - 1-2 x daily - 7 x weekly - 1 sets - 5-10 reps - 5-20 hold - Supine Lower Trunk Rotation  - 1 x daily - 7 x weekly - 1 sets - 5-10 reps - 10 hold - Modified Thomas Stretch  - 1 x daily - 7 x weekly - 1 sets - 2-3 reps - 30 hold - Standing Hip Flexor Stretch  - 1 x daily - 7 x weekly - 1 sets - 2-3 reps - 30 hold    ASSESSMENT:   CLINICAL IMPRESSION: Reassessed FOTO with it being improved and exceeding the expected value. The FOTO score reflects the pt's subjective report of improvement. Pt did well with STS using proper hinged hip technique. PT was continued for lumbopelvic strengthening and stability. Pt tolerated prescribed therex without adverse effects. Will progress to  hip lifting and lunges the next session. Pt will continue to benefit from skilled PT to address impairments for improved function with less pain.  OBJECTIVE IMPAIRMENTS: decreased activity tolerance, decreased balance, decreased mobility, difficulty walking, decreased ROM, increased muscle spasms, impaired flexibility, and pain.    ACTIVITY LIMITATIONS: carrying, lifting,  bending, sitting, standing, squatting, sleeping, bathing, toileting, dressing, reach over head, locomotion level, and caring for others   PARTICIPATION LIMITATIONS: meal prep, cleaning, laundry, driving, community activity, and occupation   PERSONAL FACTORS: Fitness, Past/current experiences, Profession, and Time since onset of injury/illness/exacerbation are also affecting patient's functional outcome.    REHAB POTENTIAL: Good   CLINICAL DECISION MAKING: Evolving/moderate complexity   EVALUATION COMPLEXITY: Moderate     GOALS:   SHORT TERM GOALS: Target date: 09/02/22 Pt will be Ind in an initial HEP  Baseline: initiated Goal status: MET 09/20/22   LONG TERM GOALS: Target date: 12/09/22   Pt will be Ind in a final HEP to maintain achieved LOF  Baseline: 10/07/22 Goal status: Ongoing   2.  Improve 5xSTS by MCID of 5" and by MCID of 33ft as indication of improved functional mobility  Baseline: see functional tests Status: 10/25/22: 5xSTS=22.1 s use of hands Status: 10/27/22=568ft Goal status: MET for 5xSTS; MET for   3.  Improve SLS to greater than 15" as demonstration of improved stability/balance  Baseline: TBA Status: 10/27/22=30" each Goal status: MET   4.  Improve pt's core strength as demonstrated by maintaining a bridge for 60" and a plank from her knees for 30" Baseline: TBA Status: bridging 60", Plank from toes 50" Goal status: Improving   5.  Improve pt's bilat hip strength for 4+/5 for improved stability and function Baseline: see flow sheets Status:11/03/22= see flow sheets Goal status: Improved   6.  Pt will report a decrease in LBP and L LE pain to 5/10 or for improved function and QOL Baseline: 4-9/10 11/16/22: 2-9/10 Goal status: Ongoing    PLAN:   PT FREQUENCY: 2x/week   PT DURATION: other: 6 weeks   PLANNED INTERVENTIONS: Therapeutic exercises, Therapeutic activity, Balance training, Gait training, Patient/Family education, Self Care, Joint  mobilization, Stair training, Aquatic Therapy, Dry Needling, Electrical stimulation, Spinal mobilization, Cryotherapy, Moist heat, Taping, Ionotophoresis 4mg /ml Dexamethasone, Manual therapy, and Re-evaluation   PLAN FOR NEXT SESSION: Review FOTO; assess response to HEP; progress therex as indicated; use of modalities, manual therapy; and TPDN as indicated. Hip hinge/ STS with control   Saige Busby MS, PT 11/24/22 3:17 PM

## 2022-11-24 ENCOUNTER — Ambulatory Visit: Payer: No Typology Code available for payment source

## 2022-11-24 DIAGNOSIS — R262 Difficulty in walking, not elsewhere classified: Secondary | ICD-10-CM

## 2022-11-24 DIAGNOSIS — M5459 Other low back pain: Secondary | ICD-10-CM

## 2022-11-24 DIAGNOSIS — M6281 Muscle weakness (generalized): Secondary | ICD-10-CM

## 2022-11-24 DIAGNOSIS — R208 Other disturbances of skin sensation: Secondary | ICD-10-CM

## 2022-11-29 NOTE — Therapy (Signed)
OUTPATIENT PHYSICAL THERAPY TREATMENT NOTE   Patient Name: Abigail Reynolds MRN: [id number] DOB:[birth year], 47 y.o., female Today's Date: 12/01/2022  PCP: [facility]   REFERRING PROVIDER: Lisbeth Renshaw, MD   See note below for Objective Data and Assessment of Progress/Goals.      END OF SESSION:   PT End of Session - 11/30/22 1555     Visit Number 16    Number of Visits 19    Date for PT Re-Evaluation 12/09/22    Authorization Type [place]    Authorization Time Period 08/31/2022 - 03/04/2023    Authorization - Visit Number 16    Authorization - Number of Visits 19    Progress Note Due on Visit 20    PT Start Time 1548    PT Stop Time 1630    PT Time Calculation (min) 42 min    Activity Tolerance Patient tolerated treatment well    Behavior During Therapy WFL for tasks assessed/performed                     Past Medical History:  Diagnosis Date   Arthritis    Depression    GERD (gastroesophageal reflux disease)    Headache    PTSD (post-traumatic stress disorder)    Seizures (HCC)    FOCAL SEIZURES  NO IN 2 YEARS "last sezuire over 5 years ago" per pt   Past Surgical History:  Procedure Laterality Date   BACK SURGERY     L4 AND L5 2014   CERVICAL SPINE SURGERY     C 4 AND C5   COLONOSCOPY     DILATION AND CURETTAGE OF UTERUS     DILITATION & CURRETTAGE/HYSTROSCOPY WITH HYDROTHERMAL ABLATION N/A 01/01/2014   Procedure: DILATATION & CURETTAGE/HYSTEROSCOPY WITH attempted HYDROTHERMAL ABLATION;  Surgeon: Kathreen Cosier, MD;  Location: [facility];  Service: Gynecology;  Laterality: N/A;   LASIK     TONSILLECTOMY     TUBAL LIGATION     Patient Active Problem List   Diagnosis Date Noted   Spondylolisthesis of lumbar region 06/03/2022   Ankle instability 06/28/2021   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 Hx of chronic LBP   SUBJECTIVE:    SUBJECTIVE STATEMENT: I wake up stiff and the stretches help me to get going. Pt reports no flare ups since the last session   PERTINENT HISTORY: Neck pain, Cervical surgery, PTSD, HAs   PAIN:  Are you having pain? Yes: NPRS scale: 0/10  Pain location: low back, right Pain description: pressure  Aggravating factors: prolonged sitting (20 min), standing and walking (30 mins standing and walking) Relieving factors: changing positions, lying down Pain range on eval 2-9/10   PRECAUTIONS: Back 5# lifting restriction; and No BLT as per pt. Spoke with Dr. Val Riles office and the pt's lifting and movement restrictions have been lifted.   WEIGHT BEARING RESTRICTIONS: No   FALLS:  Has patient fallen in last 6 months? 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   Fluoroscopy time: 54 seconds.   Cumulative air Kerma 38.13 mGy   Images: 2   EXAM: LUMBAR SPINE - 2-3 VIEW   COMPARISON:  None Available.   FINDINGS: Imaging was provided during placement of pedicle rods and screws at L4, L5, and S1. The right-sided L4 screw distal tip appears to extend just above  the superior endplate. Hardware is otherwise in good position. Disc spacer devices are noted.   IMPRESSION: The right L4 screw distal tip appears to extend just above the superior endplate. Hardware is otherwise in good position.     On: 06/03/2022 12:38      PATIENT SURVEYS:  FOTO: Perceived function   47%, predicted   54%. 11/24/22=56%   COGNITION: Overall cognitive status: Within functional limits for tasks assessed                         SENSATION: WFL   EDEMA:             None observed    MUSCLE LENGTH: Hamstrings: Right  50 deg; Left 45 deg Thomas test: Right NT deg; Left NT deg   POSTURE: rounded shoulders and forward head   PALPATION: TTP R low back paraspinals and QL with increase muscle tension   LOWER EXTREMITY MMT: Decreased core strength MMT ROM Right eval Left eval Rt 11/03/22 LT 11/03/22  Hip flexion 3+ 3+ 4 4  Hip extension 3 3 3+ 3+  Hip abduction 3 3 4+ 4+  Hip adduction        Hip internal rotation        Hip external rotation 3+ 3+ 4+ 4+  Knee flexion 4+ 4+    Knee extension 4+ 4+    Ankle dorsiflexion 4 4    Ankle plantarflexion        Ankle inversion        Ankle eversion         (Blank rows = not tested)   LOWER EXTREMITY ROM: Grossly WNLs ROM Right eval Left eval  Hip flexion      Hip extension      Hip abduction      Hip adduction      Hip internal rotation      Hip external rotation      Knee flexion      Knee extension      Ankle dorsiflexion      Ankle plantarflexion      Ankle inversion      Ankle eversion       (Blank rows = not tested)   Lumbar ROM: NT due to pt reported mobility restrictions   LOWER EXTREMITY SPECIAL TESTS:  NT   FUNCTIONAL TESTS:  5 times sit to stand: 08/22/22: 51 seconds with UE standard mat 2 minute walk test: 323 feet 08/22/22 SLS: TBA   GAIT: Distance walked: 224ft Assistive device utilized: None Level of assistance: Complete Independence Comments: Decreased pace     TODAY'S  TREATMENT [facility] Adult PT Treatment:  DATE: 11/30/22 Therapeutic Exercise: Nustep L5 6 mins UE/LE 90/90 abd bracing x5 10" Dead bug 2x12 Planks x10, 10" 60" longest Prone on pillows hip ext  x10 Prone on pillows alt arm/leg lifts  2x10 Marching on airex  [facility] Adult PT Treatment:                                                DATE: 11/24/22 Therapeutic Exercise: Nustep L5 6 mins UE/LE Seated trunk flexion Standing Row Standing shoulder ext 2x10 GTB Palloff side steps c press double RTB x 12 each   Therapeutic Activity: Reassessed FOTO with review of results, expected value met Hip hinge with wooden dowel x 10 standing and seated Hip hinge sit to stand with 10 lbs Marching on airex Standing balance on airex, narrow stance, ABC c heavy yellow ball   PATIENT EDUCATION:  Education details: Eval findings, POC, HEP, self care  Person educated: Patient Education method: Explanation, Demonstration, Tactile cues, Verbal cues, and Handouts Education comprehension: verbalized understanding, returned demonstration, verbal cues required, and tactile cues required   HOME EXERCISE PROGRAM: Access Code: [email or web address] URL: [email or web address] Date: 08/24/2022 Prepared by: Joellyn Rued  Exercises - Standing Shoulder Row with Anchored Resistance  - 1-2 x daily - 7 x weekly - 2-3 sets - 10 reps - Shoulder extension with resistance - Neutral  - 1-2 x daily - 7 x weekly - 2-3 sets - 10 reps - Mini Squat with Counter Support  - 1-2 x daily - 7 x weekly - 2-3 sets - 10 reps - Supine Posterior Pelvic Tilt  - 1-2 x daily - 7 x weekly - 2-3 sets - 10 reps - 3 hold - Supine Bridge  - 1-2 x daily - 7 x weekly - 1 sets - 5-10 reps - 3 hold - Hooklying Single Knee to Chest  - 1-2 x daily - 7 x weekly - 1 sets - 3 reps - 20 hold - Seated Flexion Stretch with Swiss Ball  - 1-2 x daily - 7 x weekly - 1 sets - 5-10 reps - 5-20 hold - Supine Lower Trunk  Rotation  - 1 x daily - 7 x weekly - 1 sets - 5-10 reps - 10 hold - Modified Thomas Stretch  - 1 x daily - 7 x weekly - 1 sets - 2-3 reps - 30 hold - Standing Hip Flexor Stretch  - 1 x daily - 7 x weekly - 1 sets - 2-3 reps - 30 hold    ASSESSMENT:   CLINICAL IMPRESSION: Pt participated in PT to address lumbopelvic and LE strengthening to improve stability for improved function with less pain. Pt demonstrates improved stability per forward plank from toes which the pt was able to complete for 60 sec. Pt tolerated PT today without adverse effects. Pt will continue to benefit from skilled PT to address impairments for improved function.  OBJECTIVE IMPAIRMENTS: decreased activity tolerance, decreased balance, decreased mobility, difficulty walking, decreased ROM, increased muscle spasms, impaired flexibility, and pain.    ACTIVITY LIMITATIONS: carrying, lifting, bending, sitting, standing, squatting, sleeping, bathing, toileting, dressing, reach over head, locomotion level, and caring for others   PARTICIPATION LIMITATIONS: meal prep, cleaning, laundry, driving, community activity, and occupation   PERSONAL FACTORS: Fitness, Past/current experiences, Profession, and Time since onset of injury/illness/exacerbation are also affecting patient's functional outcome.    REHAB  POTENTIAL: Good   CLINICAL DECISION MAKING: Evolving/moderate complexity   EVALUATION COMPLEXITY: Moderate     GOALS:   SHORT TERM GOALS: Target date: 09/02/22 Pt will be Ind in an initial HEP  Baseline: initiated Goal status: MET 09/20/22   LONG TERM GOALS: Target date: 12/09/22   Pt will be Ind in a final HEP to maintain achieved LOF  Baseline: 10/07/22 Goal status: Ongoing   2.  Improve 5xSTS by MCID of 5" and by MCID of 55ft as indication of improved functional mobility  Baseline: see functional tests Status: 10/25/22: 5xSTS=22.1 s use of hands Status: 10/27/22=510ft Goal status: MET for 5xSTS; MET for    3.  Improve SLS to greater than 15" as demonstration of improved stability/balance  Baseline: TBA Status: 10/27/22=30" each Goal status: MET   4.  Improve pt's core strength as demonstrated by maintaining a bridge for 60" and a plank from her knees for 30" Baseline: TBA Status: bridging 60", Plank from toes 50" 11/30/22=60" plank from toes Goal status: MET   5.  Improve pt's bilat hip strength for 4+/5 for improved stability and function Baseline: see flow sheets Status:11/03/22= see flow sheets Goal status: Improved   6.  Pt will report a decrease in LBP and L LE pain to 5/10 or for improved function and QOL Baseline: 4-9/10 11/16/22: 2-9/10 Goal status: Ongoing    PLAN:   PT FREQUENCY: 2x/week   PT DURATION: other: 6 weeks   PLANNED INTERVENTIONS: Therapeutic exercises, Therapeutic activity, Balance training, Gait training, Patient/Family education, Self Care, Joint mobilization, Stair training, Aquatic Therapy, Dry Needling, Electrical stimulation, Spinal mobilization, Cryotherapy, Moist heat, Taping, Ionotophoresis 4mg /ml Dexamethasone, Manual therapy, and Re-evaluation   PLAN FOR NEXT SESSION: Review FOTO; assess response to HEP; progress therex as indicated; use of modalities, manual therapy; and TPDN as indicated. Hip hinge/ STS with control   Maikol Grassia MS, PT 12/01/22 1:56 PM

## 2022-11-30 ENCOUNTER — Ambulatory Visit: Payer: No Typology Code available for payment source | Attending: Neurosurgery

## 2022-11-30 DIAGNOSIS — R208 Other disturbances of skin sensation: Secondary | ICD-10-CM | POA: Diagnosis present

## 2022-11-30 DIAGNOSIS — R293 Abnormal posture: Secondary | ICD-10-CM | POA: Insufficient documentation

## 2022-11-30 DIAGNOSIS — M5459 Other low back pain: Secondary | ICD-10-CM | POA: Diagnosis present

## 2022-11-30 DIAGNOSIS — M542 Cervicalgia: Secondary | ICD-10-CM | POA: Diagnosis present

## 2022-11-30 DIAGNOSIS — M6281 Muscle weakness (generalized): Secondary | ICD-10-CM | POA: Diagnosis present

## 2022-11-30 DIAGNOSIS — R262 Difficulty in walking, not elsewhere classified: Secondary | ICD-10-CM | POA: Diagnosis present

## 2022-12-08 ENCOUNTER — Ambulatory Visit: Payer: No Typology Code available for payment source

## 2022-12-13 NOTE — Therapy (Signed)
OUTPATIENT PHYSICAL THERAPY TREATMENT NOTE/Cervical Evaluation/ReCert Lumbar   Patient Name: Abigail Reynolds MRN: [id number] DOB:May 01, 1976, 47 y.o., female Today's Date: 12/15/2022  PCP: [facility]   REFERRING PROVIDER: Lisbeth Renshaw, MD   See note below for Objective Data and Assessment of Progress/Goals.      END OF SESSION:   PT End of Session - 12/14/22 1506     Visit Number 17    Number of Visits 31    Date for PT Re-Evaluation 02/04/23    Authorization Type [facility]    Authorization Time Period 08/31/2022 - 03/04/2023    Authorization - Visit Number 17    Authorization - Number of Visits 19    Progress Note Due on Visit 20    PT Start Time 1415    PT Stop Time 1500    PT Time Calculation (min) 45 min    Activity Tolerance Patient tolerated treatment well    Behavior During Therapy WFL for tasks assessed/performed                      Past Medical History:  Diagnosis Date   Arthritis    Depression    GERD (gastroesophageal reflux disease)    Headache    PTSD (post-traumatic stress disorder)    Seizures (HCC)    FOCAL SEIZURES  NO IN 2 YEARS "last sezuire over 5 years ago" per pt   Past Surgical History:  Procedure Laterality Date   BACK SURGERY     L4 AND L5 2014   CERVICAL SPINE SURGERY     C 4 AND C5   COLONOSCOPY     DILATION AND CURETTAGE OF UTERUS     DILITATION & CURRETTAGE/HYSTROSCOPY WITH HYDROTHERMAL ABLATION N/A 01/01/2014   Procedure: DILATATION & CURETTAGE/HYSTEROSCOPY WITH attempted HYDROTHERMAL ABLATION;  Surgeon: Kathreen Cosier, MD;  Location: [facility];  Service: Gynecology;  Laterality: N/A;   LASIK     TONSILLECTOMY     TUBAL LIGATION     Patient Active Problem List   Diagnosis Date Noted   Spondylolisthesis of lumbar region 06/03/2022   Ankle instability 06/28/2021   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 Hx of chronic LBP   SUBJECTIVE:    SUBJECTIVE STATEMENT: Pt reports she talk to Dr. Conchita Paris about her neck pain and he wrote a prescription for PT. 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 Pt notes she has pain with L neck rotation especially when driving.  Her neck has been bothering her for about the same time her low back. Pt endorses pain and tingling into her L arm.  PERTINENT HISTORY: Neck pain, Cervical surgery, PTSD, HAs   PAIN:  Are you having pain? Yes: NPRS scale: 0/10  Pain location: low back, right Pain description: pressure  Aggravating factors: prolonged sitting (20 min), standing and walking (30 mins standing and walking) Relieving factors: changing positions, lying down Pain range on eval 2-9/10  Are  you having pain? Yes: NPRS scale: 3/10  Pain location: L neck and upper shoulder, and sometimes to the L arm Pain description: Ache, tingling, sharp Aggravating factors: lap top, cooking, washing, reading Relieving factors: HEP, muscle relaxor Pain range on eval 3-8/10   PRECAUTIONS: Back 5# lifting restriction; and No BLT as per pt. Spoke with Dr. Val Riles office and the pt's lifting and movement restrictions have been lifted.   WEIGHT BEARING RESTRICTIONS: No   FALLS:  Has patient fallen in last 6 months? 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   Fluoroscopy time: 54 seconds.   Cumulative air Kerma 38.13 mGy   Images: 2   EXAM: LUMBAR SPINE - 2-3 VIEW   COMPARISON:  None Available.   FINDINGS: Imaging was provided during placement of pedicle rods and screws at L4, L5, and S1. The right-sided L4 screw distal tip appears to extend just above the superior endplate. Hardware is otherwise in good position. Disc spacer devices are noted.   IMPRESSION: The right L4 screw distal tip appears to extend just above the superior endplate. Hardware is otherwise in good position.     On: 06/03/2022 12:38      PATIENT SURVEYS:  FOTO: Perceived function   47%, predicted   54%. 11/24/22=56%   COGNITION: Overall cognitive status: Within functional limits for tasks assessed                         SENSATION: WFL   EDEMA:             None observed    MUSCLE LENGTH: Hamstrings: Right  50 deg; Left 45 deg Thomas test: Right NT deg; Left NT deg   POSTURE: rounded shoulders and forward head   PALPATION: TTP R low back paraspinals and QL with increase muscle tension   LOWER EXTREMITY MMT: Decreased core strength MMT ROM Right eval Left eval Rt 11/03/22 LT 11/03/22  Hip flexion 3+ 3+ 4 4  Hip extension 3 3 3+ 3+  Hip abduction 3 3 4+ 4+  Hip adduction        Hip internal rotation        Hip external rotation 3+ 3+ 4+ 4+  Knee flexion 4+ 4+    Knee extension 4+ 4+    Ankle dorsiflexion 4 4    Ankle plantarflexion        Ankle inversion        Ankle eversion         (Blank rows = not  tested)   LOWER EXTREMITY ROM: Grossly WNLs ROM Right eval Left eval  Hip flexion      Hip extension      Hip abduction      Hip adduction      Hip internal rotation      Hip external rotation      Knee flexion      Knee extension      Ankle dorsiflexion  Ankle plantarflexion      Ankle inversion      Ankle eversion       (Blank rows = not tested)   Lumbar ROM: NT due to pt reported mobility restrictions   LOWER EXTREMITY SPECIAL TESTS:  NT   FUNCTIONAL TESTS:  5 times sit to stand: 08/22/22: 51 seconds with UE standard mat 2 minute walk test: 323 feet 08/22/22 SLS: TBA   GAIT: Distance walked: 289ft Assistive device utilized: None Level of assistance: Complete Independence Comments: Decreased pace  CERVICAL EVAL  POSTURE:  Forwad head and rounded shoulders  PALPATION: TTP to the upper trap and cervical paraspinals   CERVICAL ROM:   Active ROM A/PROM (deg) 12/15/2022  Flexion 30 pull L post neck  Extension 30   Right lateral flexion 30  Left lateral flexion 25 Pressure pain L lat neck  Right rotation 45 pull L lat neck  Left rotation 35 pain L lateral neck   (Blank rows = not tested)  UE ROM:  Grossly WNLS and equal Active ROM Right 12/15/2022 Left 12/15/2022  Shoulder flexion    Shoulder extension    Shoulder abduction    Shoulder adduction    Shoulder extension    Shoulder internal rotation    Shoulder external rotation    Elbow flexion    Elbow extension    Wrist flexion    Wrist extension    Wrist ulnar deviation    Wrist radial deviation    Wrist pronation    Wrist supination     (Blank rows = not tested)  UE MMT: 4+ to 5/5 bilat and equal MMT Right 12/15/2022 Left 12/15/2022  Shoulder flexion    Shoulder extension    Shoulder abduction    Shoulder adduction    Shoulder extension    Shoulder internal rotation    Shoulder external rotation    Middle trapezius    Lower trapezius    Elbow flexion    Elbow extension     Wrist flexion    Wrist extension    Wrist ulnar deviation    Wrist radial deviation    Wrist pronation    Wrist supination    Grip strength     (Blank rows = not tested)  CERVICAL SPECIAL TESTS:  Spurling's test: Positive     TODAY'S TREATMENT [facility] Adult PT Treatment:                                                DATE: 12/14/22 Therapeutic Exercise: Nustep L5 8 mins UE/LE Upper trap stretch Manual Therapy: STM to the L upper trap Skilled palpation to identify TrPs and taut muscle bands Trigger Point Dry Needling Treatment: Pre-treatment instruction: Patient instructed on dry needling rationale, procedures, and possible side effects including pain during treatment (achy,cramping feeling), bruising, drop of blood, lightheadedness, nausea, sweating. Patient Consent Given: Yes Education handout provided: Yes Muscles treated: L upper trap  Needle size and number: .30x7mm x 1 Electrical stimulation performed: No Parameters: N/A Treatment response/outcome: Twitch response elicited Post-treatment instructions: Patient instructed to expect possible mild to moderate muscle soreness later today and/or tomorrow. Patient instructed in methods to reduce muscle soreness and to continue prescribed HEP. If patient was dry needled over the lung field, patient was instructed on signs and symptoms of pneumothorax and, however unlikely, to see immediate medical attention should  they occur. Patient was also educated on signs and symptoms of infection and to seek medical attention should they occur. Patient verbalized understanding of these instructions and education.   [facility] Adult PT Treatment:                                                DATE: 11/30/22 Therapeutic Exercise: Nustep L5 6 mins UE/LE 90/90 abd bracing x5 10" Dead bug 2x12 Planks x10, 10" 60" longest Prone on pillows hip ext  x10 Prone on pillows alt arm/leg lifts  2x10 Marching on airex  [facility] Adult PT Treatment:                                                 DATE: 11/24/22 Therapeutic Exercise: Nustep L5 6 mins UE/LE Seated trunk flexion Standing Row Standing shoulder ext 2x10 GTB Palloff side steps c press double RTB x 12 each   Therapeutic Activity: Reassessed FOTO with review of results, expected value met Hip hinge with wooden dowel x 10 standing and seated Hip hinge sit to stand with 10 lbs Marching on airex Standing balance on airex, narrow stance, ABC c heavy yellow ball   PATIENT EDUCATION:  Education details: Eval findings, POC, HEP, self care  Person educated: Patient Education method: Explanation, Demonstration, Tactile cues, Verbal cues, and Handouts Education comprehension: verbalized understanding, returned demonstration, verbal cues required, and tactile cues required   HOME EXERCISE PROGRAM: Access Code: [email or web address] URL: [email or web address] Date: 08/24/2022 Prepared by: Joellyn Rued  Exercises - Standing Shoulder Row with Anchored Resistance  - 1-2 x daily - 7 x weekly - 2-3 sets - 10 reps - Shoulder extension with resistance - Neutral  - 1-2 x daily - 7 x weekly - 2-3 sets - 10 reps - Mini Squat with Counter Support  - 1-2 x daily - 7 x weekly - 2-3 sets - 10 reps - Supine Posterior Pelvic Tilt  - 1-2 x daily - 7 x weekly - 2-3 sets - 10 reps - 3 hold - Supine Bridge  - 1-2 x daily - 7 x weekly - 1 sets - 5-10 reps - 3 hold - Hooklying Single Knee to Chest  - 1-2 x daily - 7 x weekly - 1 sets - 3 reps - 20 hold - Seated Flexion Stretch with Swiss Ball  - 1-2 x daily - 7 x weekly - 1 sets - 5-10 reps - 5-20 hold - Supine Lower Trunk Rotation  - 1 x daily - 7 x weekly - 1 sets - 5-10 reps - 10 hold - Modified Thomas Stretch  - 1 x daily - 7 x weekly - 1 sets - 2-3 reps - 30 hold - Standing Hip Flexor Stretch  - 1 x daily - 7 x weekly - 1 sets - 2-3 reps - 30 hold    ASSESSMENT:   CLINICAL IMPRESSION: Pt was referred to PT c the Dx of M47.812 (ICD-10-CM) - Spondylosis without  myelopathy or radiculopathy, cervical region. Pt presents with postural deficits, decreased cervical mobility, a positive Spurling's test L, radicular symptoms of the L UE, and increased muscle tension of the L upper trap and cervical paraspinals. Following STM TPDN was completed to the L upper  trap with muscle twitches elicited. Pt experienced increased muscle soreness as anticipated, but otherwise tolerated PT well. Will assess pt's complete response to PT the next PT session. Pt will benefit from skilled PT 2w6 to address impairments to optimize neck function with less pain. Will need to request additional auth from the [place] for the pt's cervcical issues with 2 visits remaining for her current auth period.  OBJECTIVE IMPAIRMENTS: decreased activity tolerance, decreased balance, decreased mobility, difficulty walking, decreased ROM, increased muscle spasms, impaired flexibility, and pain.    ACTIVITY LIMITATIONS: carrying, lifting, bending, sitting, standing, squatting, sleeping, bathing, toileting, dressing, reach over head, locomotion level, and caring for others   PARTICIPATION LIMITATIONS: meal prep, cleaning, laundry, driving, community activity, and occupation   PERSONAL FACTORS: Fitness, Past/current experiences, Profession, and Time since onset of injury/illness/exacerbation are also affecting patient's functional outcome.    REHAB POTENTIAL: Good   CLINICAL DECISION MAKING: Evolving/moderate complexity   EVALUATION COMPLEXITY: Moderate     GOALS:   SHORT TERM GOALS: Target date: 09/02/22 Pt will be Ind in an initial HEP  Baseline: initiated Goal status: MET 09/20/22   LONG TERM GOALS: Target date: 02/04/23   Pt will be Ind in a final HEP to maintain achieved LOF  Baseline: 10/07/22 Goal status: Ongoing   2.  Improve 5xSTS by MCID of 5" and by MCID of 9ft as indication of improved functional mobility  Baseline: see functional tests Status: 10/25/22: 5xSTS=22.1 s use of  hands Status: 10/27/22=540ft Goal status: MET for 5xSTS; MET for   3.  Improve SLS to greater than 15" as demonstration of improved stability/balance  Baseline: TBA Status: 10/27/22=30" each Goal status: MET   4.  Improve pt's core strength as demonstrated by maintaining a bridge for 60" and a plank from her knees for 30" Baseline: TBA Status: bridging 60", Plank from toes 50" 11/30/22=60" plank from toes Goal status: MET   5.  Improve pt's bilat hip strength for 4+/5 for improved stability and function Baseline: see flow sheets Status:11/03/22= see flow sheets Goal status: Improved   6.  Pt will report a decrease in LBP and L LE pain to 5/10 or for improved function and QOL Baseline: 4-9/10 11/16/22: 2-9/10 Goal status: Ongoing  LTGs for the Neck 02/04/23  Pt will report 50% improvement in neck pain for improved function and QOL. Baseline:3-8/10 Goal Status: Initial  2. Increase cervical ROM by 10d for improved function especially for rotation for driving. Baseline: See flow sheets Goal Status: Initial  3. Pt will demonstrate proper sitting posture and understanding of lap top set up to minimize neck pain.  Baseline: Forward head, rounded shoulders  Goal Status: Initial     PLAN:   PT FREQUENCY: 2x/week   PT DURATION: 6 weeks   PLANNED INTERVENTIONS: Therapeutic exercises, Therapeutic activity, Balance training, Gait training, Patient/Family education, Self Care, Joint mobilization, Stair training, Aquatic Therapy, Dry Needling, Electrical stimulation, Spinal mobilization, Cryotherapy, Moist heat, Taping, Ionotophoresis 4mg /ml Dexamethasone, Manual therapy, and Re-evaluation   PLAN FOR NEXT SESSION: Review FOTO; assess response to HEP; progress therex as indicated; use of modalities, manual therapy; and TPDN as indicated. Hip hinge/ STS with control   Placido Hangartner MS, PT 12/15/22 5:21 AM

## 2022-12-14 ENCOUNTER — Ambulatory Visit: Payer: No Typology Code available for payment source

## 2022-12-14 DIAGNOSIS — R293 Abnormal posture: Secondary | ICD-10-CM

## 2022-12-14 DIAGNOSIS — M6281 Muscle weakness (generalized): Secondary | ICD-10-CM

## 2022-12-14 DIAGNOSIS — R262 Difficulty in walking, not elsewhere classified: Secondary | ICD-10-CM

## 2022-12-14 DIAGNOSIS — R208 Other disturbances of skin sensation: Secondary | ICD-10-CM

## 2022-12-14 DIAGNOSIS — M5459 Other low back pain: Secondary | ICD-10-CM | POA: Diagnosis not present

## 2022-12-14 DIAGNOSIS — M542 Cervicalgia: Secondary | ICD-10-CM

## 2022-12-20 NOTE — Therapy (Addendum)
OUTPATIENT PHYSICAL THERAPY TREATMENT NOTE/Cervical Evaluation/ReCert Lumbar/Discharge   Patient Name: Abigail Reynolds MRN: [id number] DOB:07-17-1975, 47 y.o., female Today's Date: 12/22/2022  PCP: [facility]   REFERRING PROVIDER: Lisbeth Renshaw, MD   See note below for Objective Data and Assessment of Progress/Goals.      END OF SESSION:   PT End of Session - 12/21/22 1427     Visit Number 18    Number of Visits 31    Date for PT Re-Evaluation 02/04/23    Authorization Type [place]    Authorization Time Period 08/31/2022 - 03/04/2023    Authorization - Visit Number 18    Authorization - Number of Visits 19    Progress Note Due on Visit 20    PT Start Time 1425    PT Stop Time 1505    PT Time Calculation (min) 40 min    Activity Tolerance Patient tolerated treatment well    Behavior During Therapy WFL for tasks assessed/performed                       Past Medical History:  Diagnosis Date   Arthritis    Depression    GERD (gastroesophageal reflux disease)    Headache    PTSD (post-traumatic stress disorder)    Seizures (HCC)    FOCAL SEIZURES  NO IN 2 YEARS "last sezuire over 5 years ago" per pt   Past Surgical History:  Procedure Laterality Date   BACK SURGERY     L4 AND L5 2014   CERVICAL SPINE SURGERY     C 4 AND C5   COLONOSCOPY     DILATION AND CURETTAGE OF UTERUS     DILITATION & CURRETTAGE/HYSTROSCOPY WITH HYDROTHERMAL ABLATION N/A 01/01/2014   Procedure: DILATATION & CURETTAGE/HYSTEROSCOPY WITH attempted HYDROTHERMAL ABLATION;  Surgeon: Kathreen Cosier, MD;  Location: [facility];  Service: Gynecology;  Laterality: N/A;   LASIK     TONSILLECTOMY     TUBAL LIGATION     Patient Active Problem List   Diagnosis Date Noted   Spondylolisthesis of lumbar region 06/03/2022   Ankle instability 06/28/2021   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 Hx of chronic LBP   SUBJECTIVE:    SUBJECTIVE STATEMENT: Pt reports good relief following the TPDN session for a few days. Pt states she completed a lot of housework yesterday, during which she paced herself and her low back is doing well today. Pt is pleased she did so well afterwards.  PERTINENT HISTORY: Neck pain, Cervical surgery, PTSD, HAs   PAIN:  Are you having pain? Yes: NPRS scale: 2/10  Pain location: low back, right Pain description: pressure  Aggravating factors: prolonged sitting (20 min), standing and walking (30 mins standing and walking) Relieving factors: changing positions, lying down Pain range on eval 2-9/10  Are you having pain? Yes: NPRS scale:0/10 looking straight ahead, 6/10  with turning head L Pain location: L neck and upper shoulder, and sometimes to the L arm Pain description: Ache, tingling, sharp Aggravating factors:  lap top, cooking, washing, reading Relieving factors: HEP, muscle relaxor Pain range on eval 3-8/10   PRECAUTIONS: Back 5# lifting restriction; and No BLT as per pt. Spoke with Dr. Val Riles office and the pt's lifting and movement restrictions have been lifted.   WEIGHT BEARING RESTRICTIONS: No   FALLS:  Has patient fallen in last 6 months? 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   Fluoroscopy time: 54 seconds.   Cumulative air Kerma 38.13 mGy   Images: 2   EXAM: LUMBAR SPINE - 2-3 VIEW   COMPARISON:  None Available.   FINDINGS: Imaging was provided during placement of pedicle rods and screws at L4, L5, and S1. The right-sided L4 screw distal tip appears to extend just above the superior endplate. Hardware is otherwise in good position. Disc spacer devices are noted.   IMPRESSION: The right L4 screw distal tip appears to extend just above the superior endplate. Hardware is otherwise in good position.     On: 06/03/2022 12:38      PATIENT SURVEYS:  FOTO: Perceived function   47%, predicted   54%. 11/24/22=56%   COGNITION: Overall cognitive status: Within functional limits for tasks assessed                         SENSATION: WFL   EDEMA:             None observed    MUSCLE LENGTH: Hamstrings: Right  50 deg; Left 45 deg Thomas test: Right NT deg; Left NT deg   POSTURE: rounded shoulders and forward head   PALPATION: TTP R low back paraspinals and QL with increase muscle tension   LOWER EXTREMITY MMT: Decreased core strength MMT ROM Right eval Left eval Rt 11/03/22 LT 11/03/22  Hip flexion 3+ 3+ 4 4  Hip extension 3 3 3+ 3+  Hip abduction 3 3 4+ 4+  Hip adduction        Hip internal rotation        Hip external rotation 3+ 3+ 4+ 4+  Knee flexion 4+ 4+    Knee extension 4+ 4+    Ankle dorsiflexion 4 4    Ankle plantarflexion        Ankle inversion        Ankle eversion         (Blank rows = not tested)   LOWER EXTREMITY ROM: Grossly WNLs ROM Right eval Left eval  Hip flexion      Hip extension      Hip abduction       Hip adduction      Hip internal rotation      Hip external rotation      Knee flexion      Knee extension      Ankle dorsiflexion      Ankle plantarflexion      Ankle inversion      Ankle eversion       (Blank  rows = not tested)   Lumbar ROM: NT due to pt reported mobility restrictions   LOWER EXTREMITY SPECIAL TESTS:  NT   FUNCTIONAL TESTS:  5 times sit to stand: 08/22/22: 51 seconds with UE standard mat 2 minute walk test: 323 feet 08/22/22 SLS: TBA   GAIT: Distance walked: 2104ft Assistive device utilized: None Level of assistance: Complete Independence Comments: Decreased pace  CERVICAL EVAL  POSTURE:  Forwad head and rounded shoulders  PALPATION: TTP to the upper trap and cervical paraspinals   CERVICAL ROM:   Active ROM A/PROM (deg) 12/22/2022  Flexion 30 pull L post neck  Extension 30   Right lateral flexion 30  Left lateral flexion 25 Pressure pain L lat neck  Right rotation 45 pull L lat neck  Left rotation 35 pain L lateral neck   (Blank rows = not tested)  UE ROM:  Grossly WNLS and equal Active ROM Right 12/22/2022 Left 12/22/2022  Shoulder flexion    Shoulder extension    Shoulder abduction    Shoulder adduction    Shoulder extension    Shoulder internal rotation    Shoulder external rotation    Elbow flexion    Elbow extension    Wrist flexion    Wrist extension    Wrist ulnar deviation    Wrist radial deviation    Wrist pronation    Wrist supination     (Blank rows = not tested)  UE MMT: 4+ to 5/5 bilat and equal MMT Right 12/22/2022 Left 12/22/2022  Shoulder flexion    Shoulder extension    Shoulder abduction    Shoulder adduction    Shoulder extension    Shoulder internal rotation    Shoulder external rotation    Middle trapezius    Lower trapezius    Elbow flexion    Elbow extension    Wrist flexion    Wrist extension    Wrist ulnar deviation    Wrist radial deviation    Wrist pronation    Wrist supination    Grip  strength     (Blank rows = not tested)  CERVICAL SPECIAL TESTS:  Spurling's test: Positive     TODAY'S TREATMENT [facility] Adult PT Treatment:                                                DATE: 12/21/22 Therapeutic Exercise: Nustep L5 7 mins UE/LE Supine chin tucks x10 5" Supine deep neck flexor lifts x10 5" Supine cervical rotation c and s chin tucks 15 sec Upper trap stretch 15" Updated HEP for cervical therex Manual Therapy: STM to the L upper trap and cervical paraspinals Skilled palpation to identify TrPs and taut muscle bands Trigger Point Dry Needling Treatment: Pre-treatment instruction: Patient instructed on dry needling rationale, procedures, and possible side effects including pain during treatment (achy,cramping feeling), bruising, drop of blood, lightheadedness, nausea, sweating. Patient Consent Given: Yes Education handout provided: Yes Muscles treated: L upper trap  Needle size and number: .30x15mm x 1 Electrical stimulation performed: No Parameters: N/A Treatment response/outcome: Twitch response elicited Post-treatment instructions: Patient instructed to expect possible mild to moderate muscle soreness later today and/or tomorrow. Patient instructed in methods to reduce muscle soreness and to continue prescribed HEP. If patient was dry needled over the lung field, patient was instructed on signs and symptoms of pneumothorax and,  however unlikely, to see immediate medical attention should they occur. Patient was also educated on signs and symptoms of infection and to seek medical attention should they occur. Patient verbalized understanding of these instructions and education.   [facility] Adult PT Treatment:                                                DATE: 12/14/22 Therapeutic Exercise: Nustep L5 8 mins UE/LE Upper trap stretch Manual Therapy: STM to the L upper trap Skilled palpation to identify TrPs and taut muscle bands Trigger Point Dry Needling  Treatment: Pre-treatment instruction: Patient instructed on dry needling rationale, procedures, and possible side effects including pain during treatment (achy,cramping feeling), bruising, drop of blood, lightheadedness, nausea, sweating. Patient Consent Given: Yes Education handout provided: Yes Muscles treated: L upper trap  Needle size and number: .30x61mm x 1 Electrical stimulation performed: No Parameters: N/A Treatment response/outcome: Twitch response elicited Post-treatment instructions: Patient instructed to expect possible mild to moderate muscle soreness later today and/or tomorrow. Patient instructed in methods to reduce muscle soreness and to continue prescribed HEP. If patient was dry needled over the lung field, patient was instructed on signs and symptoms of pneumothorax and, however unlikely, to see immediate medical attention should they occur. Patient was also educated on signs and symptoms of infection and to seek medical attention should they occur. Patient verbalized understanding of these instructions and education.   [facility] Adult PT Treatment:                                                DATE: 11/30/22 Therapeutic Exercise: Nustep L5 6 mins UE/LE 90/90 abd bracing x5 10" Dead bug 2x12 Planks x10, 10" 60" longest Prone on pillows hip ext  x10 Prone on pillows alt arm/leg lifts  2x10 Marching on airex   PATIENT EDUCATION:  Education details: Eval findings, POC, HEP, self care  Person educated: Patient Education method: Explanation, Demonstration, Tactile cues, Verbal cues, and Handouts Education comprehension: verbalized understanding, returned demonstration, verbal cues required, and tactile cues required   HOME EXERCISE PROGRAM: Access Code: [email or web address] URL: [email or web address] Date: 12/21/2022 Prepared by: Joellyn Rued  Exercises - Standing Shoulder Row with Anchored Resistance  - 1-2 x daily - 7 x weekly - 2-3 sets - 10 reps - Shoulder extension  with resistance - Neutral  - 1-2 x daily - 7 x weekly - 2-3 sets - 10 reps - Mini Squat with Counter Support  - 1-2 x daily - 7 x weekly - 2-3 sets - 10 reps - Supine Posterior Pelvic Tilt  - 1-2 x daily - 7 x weekly - 2-3 sets - 10 reps - 3 hold - Supine Bridge  - 1-2 x daily - 7 x weekly - 1 sets - 5-10 reps - 3 hold - Hooklying Single Knee to Chest  - 1-2 x daily - 7 x weekly - 1 sets - 3 reps - 20 hold - Seated Flexion Stretch with Swiss Ball  - 1-2 x daily - 7 x weekly - 1 sets - 5-10 reps - 5-20 hold - Supine Lower Trunk Rotation  - 1 x daily - 7 x weekly - 1 sets - 5-10 reps - 10 hold -  Modified Thomas Stretch  - 1 x daily - 7 x weekly - 1 sets - 2-3 reps - 30 hold - Standing Hip Flexor Stretch  - 1 x daily - 7 x weekly - 1 sets - 2-3 reps - 30 hold - Supine Cervical Retraction with Towel  - 1 x daily - 7 x weekly - 1 sets - 10 reps - 5 hold - Supine Deep Neck Flexor Training - Repetitions  - 1 x daily - 7 x weekly - 1 sets - 10 reps - 5 hold - Supine Cervical Rotation PROM  - 1 x daily - 7 x weekly - 1 sets - 5 reps - 15 hold    ASSESSMENT:   CLINICAL IMPRESSION: Pt experienced a positive result of decreased L neck/upper shoulder pain following her 1st session of TPDN. The relief lasted for a few days. PT was completed today for manual therapy to the L neck and upper shoulder as noted above f/b TPDN to the L upper trap. Therex for postural and posterior chain strengthening and cervical ROM/stretching were then completed for for muscle activation. Pt tolerated PT today without adverse effects. Pt will benefit from skilled PT to address impairments to optimize neck function with less pain.   OBJECTIVE IMPAIRMENTS: decreased activity tolerance, decreased balance, decreased mobility, difficulty walking, decreased ROM, increased muscle spasms, impaired flexibility, and pain.    ACTIVITY LIMITATIONS: carrying, lifting, bending, sitting, standing, squatting, sleeping, bathing, toileting,  dressing, reach over head, locomotion level, and caring for others   PARTICIPATION LIMITATIONS: meal prep, cleaning, laundry, driving, community activity, and occupation   PERSONAL FACTORS: Fitness, Past/current experiences, Profession, and Time since onset of injury/illness/exacerbation are also affecting patient's functional outcome.    REHAB POTENTIAL: Good   CLINICAL DECISION MAKING: Evolving/moderate complexity   EVALUATION COMPLEXITY: Moderate     GOALS:   SHORT TERM GOALS: Target date: 09/02/22 Pt will be Ind in an initial HEP  Baseline: initiated Goal status: MET 09/20/22   LONG TERM GOALS: Target date: 02/04/23   Pt will be Ind in a final HEP to maintain achieved LOF  Baseline: 10/07/22 Goal status: Ongoing   2.  Improve 5xSTS by MCID of 5" and by MCID of 38ft as indication of improved functional mobility  Baseline: see functional tests Status: 10/25/22: 5xSTS=22.1 s use of hands Status: 10/27/22=520ft Goal status: MET for 5xSTS; MET for   3.  Improve SLS to greater than 15" as demonstration of improved stability/balance  Baseline: TBA Status: 10/27/22=30" each Goal status: MET   4.  Improve pt's core strength as demonstrated by maintaining a bridge for 60" and a plank from her knees for 30" Baseline: TBA Status: bridging 60", Plank from toes 50" 11/30/22=60" plank from toes Goal status: MET   5.  Improve pt's bilat hip strength for 4+/5 for improved stability and function Baseline: see flow sheets Status:11/03/22= see flow sheets Goal status: Improved   6.  Pt will report a decrease in LBP and L LE pain to 5/10 or for improved function and QOL Baseline: 4-9/10 11/16/22: 2-9/10 Goal status: Ongoing  LTGs for the Neck 02/04/23  Pt will report 50% improvement in neck pain for improved function and QOL. Baseline:3-8/10 Goal Status: Initial  2. Increase cervical ROM by 10d for improved function especially for rotation for driving. Baseline: See flow  sheets Goal Status: Initial  3. Pt will demonstrate proper sitting posture and understanding of lap top set up to minimize neck pain.  Baseline:  Forward head, rounded shoulders  Goal Status: Initial     PLAN:   PT FREQUENCY: 2x/week   PT DURATION: 6 weeks   PLANNED INTERVENTIONS: Therapeutic exercises, Therapeutic activity, Balance training, Gait training, Patient/Family education, Self Care, Joint mobilization, Stair training, Aquatic Therapy, Dry Needling, Electrical stimulation, Spinal mobilization, Cryotherapy, Moist heat, Taping, Ionotophoresis 4mg /ml Dexamethasone, Manual therapy, and Re-evaluation   PLAN FOR NEXT SESSION: Review FOTO; assess response to HEP; progress therex as indicated; use of modalities, manual therapy; and TPDN as indicated. Hip hinge/ STS with control   Tennessee Perra MS, PT 12/22/22 5:37 AM  PHYSICAL THERAPY DISCHARGE SUMMARY  Visits from Start of Care: 18  Current functional level related to goals / functional outcomes: See clinical impression and PT goals    Remaining deficits: See clinical impression and PT goals    Education / Equipment: HEP. Pt Ed   Patient agrees to discharge. Patient goals were partially met. Patient is being discharged due to not returning since the last visit.   Laddie Naeem MS, PT 05/18/23 6:22 AM

## 2022-12-21 ENCOUNTER — Ambulatory Visit: Payer: No Typology Code available for payment source

## 2022-12-21 DIAGNOSIS — R208 Other disturbances of skin sensation: Secondary | ICD-10-CM

## 2022-12-21 DIAGNOSIS — M542 Cervicalgia: Secondary | ICD-10-CM

## 2022-12-21 DIAGNOSIS — M6281 Muscle weakness (generalized): Secondary | ICD-10-CM

## 2022-12-21 DIAGNOSIS — M5459 Other low back pain: Secondary | ICD-10-CM | POA: Diagnosis not present

## 2022-12-21 DIAGNOSIS — R262 Difficulty in walking, not elsewhere classified: Secondary | ICD-10-CM

## 2022-12-21 DIAGNOSIS — R293 Abnormal posture: Secondary | ICD-10-CM

## 2023-01-03 ENCOUNTER — Ambulatory Visit: Payer: No Typology Code available for payment source

## 2023-04-28 ENCOUNTER — Emergency Department (HOSPITAL_COMMUNITY)
Admission: EM | Admit: 2023-04-28 | Discharge: 2023-04-29 | Disposition: A | Discharge status: Home / Self Care | Payer: No Typology Code available for payment source | Attending: Emergency Medicine | Admitting: Emergency Medicine

## 2023-04-28 ENCOUNTER — Other Ambulatory Visit: Payer: Self-pay

## 2023-04-28 ENCOUNTER — Encounter (HOSPITAL_COMMUNITY): Payer: Self-pay

## 2023-04-28 ENCOUNTER — Emergency Department (HOSPITAL_COMMUNITY): Payer: No Typology Code available for payment source

## 2023-04-28 DIAGNOSIS — M25561 Pain in right knee: Secondary | ICD-10-CM | POA: Diagnosis present

## 2023-04-28 NOTE — ED Triage Notes (Addendum)
Pt has been having posterior left knee pain for  a week. Has been taking motrin, muscle relaxers, TENS unit and has no relief. Pt feel twisting pain in knee. Pt is ambulatory but limping. No injury to knee but Friday she did go on a trip and was in the car for 5 hours.

## 2023-04-28 NOTE — ED Triage Notes (Signed)
Correction right knee pain

## 2023-04-29 ENCOUNTER — Ambulatory Visit (HOSPITAL_COMMUNITY): Admission: RE | Admit: 2023-04-29 | Payer: No Typology Code available for payment source | Source: Ambulatory Visit

## 2023-04-29 NOTE — ED Provider Notes (Signed)
[facility]PT [facility] Emergency Department Provider Note MRN:  [id number]  Arrival date & time: 04/29/23     Chief Complaint   Knee Pain (Right knee)   History of Present Illness   Abigail Reynolds is a 47 y.o. year-old female with a history of seizures presenting to the ED with chief complaint of knee pain.  Pain to the right knee for the past week.  Hurts more in the medial anterior area but also has some swelling behind the knee.  Not going away.  Denies chest pain or shortness of breath, no other complaints.  Review of Systems  A thorough review of systems was obtained and all systems are negative except as noted in the HPI and PMH.   Patient's Health History    Past Medical History:  Diagnosis Date   Arthritis    Depression    GERD (gastroesophageal reflux disease)    Headache    PTSD (post-traumatic stress disorder)    Seizures (HCC)    FOCAL SEIZURES  NO IN 2 YEARS "last sezuire over 5 years ago" per pt    Past Surgical History:  Procedure Laterality Date   BACK SURGERY     L4 AND L5 2014   CERVICAL SPINE SURGERY     C 4 AND C5   COLONOSCOPY     DILATION AND CURETTAGE OF UTERUS     DILITATION & CURRETTAGE/HYSTROSCOPY WITH HYDROTHERMAL ABLATION N/A 01/01/2014   Procedure: DILATATION & CURETTAGE/HYSTEROSCOPY WITH attempted HYDROTHERMAL ABLATION;  Surgeon: Kathreen Cosier, MD;  Location: [facility];  Service: Gynecology;  Laterality: N/A;   LASIK     TONSILLECTOMY     TUBAL LIGATION      History reviewed. No pertinent family history.  Social History   Socioeconomic History   Marital status: Married    Spouse name: Not on file   Number of children: Not on file   Years of education: Not on file   Highest education level: Not on file  Occupational History   Not on file  Tobacco Use   Smoking status: Never   Smokeless tobacco: Never  Vaping Use   Vaping status: Never Used  Substance and Sexual Activity   Alcohol use: Yes     Alcohol/week: 4.0 - 7.0 standard drinks of alcohol    Types: 4 - 7 Glasses of wine per week    Comment: sometimes drinks wine 1 glass every night then may not drink for weeks-Social   Drug use: No   Sexual activity: Not on file  Other Topics Concern   Not on file  Social History Narrative   Not on file   Social Determinants of Health   Financial Resource Strain: Not on file  Food Insecurity: No Food Insecurity (06/04/2022)   Hunger Vital Sign    Worried About Running Out of Food in the Last Year: Never true    Ran Out of Food in the Last Year: Never true  Transportation Needs: No Transportation Needs (06/04/2022)   PRAPARE - Administrator, Civil Service (Medical): No    Lack of Transportation (Non-Medical): No  Physical Activity: Not on file  Stress: Not on file  Social Connections: Not on file  Intimate Partner Violence: Not At Risk (06/04/2022)   Humiliation, Afraid, Rape, and Kick questionnaire    Fear of Current or Ex-Partner: No    Emotionally Abused: No    Physically Abused: No    Sexually Abused: No  Physical Exam   Vitals:   04/28/23 2335 04/29/23 0356  BP: (!) 145/89 (!) 150/98  Pulse: 74 92  Resp: 17 16  Temp: 98.4 F (36.9 C) 97.7 F (36.5 C)  SpO2: 98% 98%    CONSTITUTIONAL: Well-appearing, NAD NEURO/PSYCH:  Alert and oriented x 3, no focal deficits EYES:  eyes equal and reactive ENT/NECK:  no LAD, no JVD CARDIO: Regular rate, well-perfused, normal S1 and S2 PULM:  CTAB no wheezing or rhonchi GI/GU:  non-distended, non-tender MSK/SPINE:  No gross deformities, no edema SKIN:  no rash, atraumatic   *Additional and/or pertinent findings included in MDM below  Diagnostic and Interventional Summary    EKG Interpretation Date/Time:    Ventricular Rate:    PR Interval:    QRS Duration:    QT Interval:    QTC Calculation:   R Axis:      Text Interpretation:         Labs Reviewed - No data to display  DG Knee 2 Views Right  Final  Result    LE VENOUS    (Results Pending)    Medications - No data to display   Procedures  /  Critical Care Procedures  ED Course and Medical Decision Making  Initial Impression and Ddx Very subtle swelling to the right knee, less subtle swelling to the popliteal region with tenderness to palpation.  No tenderness or obvious swelling to the lower leg.  Pain started after travel and some increased walking and so favoring sprain.  Other considerations include Baker's cyst, DVT.  Past medical/surgical history that increases complexity of ED encounter: None  Interpretation of Diagnostics I personally reviewed the knee x-ray and my interpretation is as follows: No fracture    Patient Reassessment and Ultimate Disposition/Management     Patient is appropriate for discharge, plan is for return for ultrasound to further evaluate.  Patient management required discussion with the following services or consulting groups:  None  Complexity of Problems Addressed Acute complicated illness or Injury  Additional Data Reviewed and Analyzed Further history obtained from: Further history from spouse/family member  Additional Factors Impacting ED Encounter Risk None  Elmer Sow. Pilar Plate, MD [facility] Emergency Medicine [facility] [email or web address]  Final Clinical Impressions(s) / ED Diagnoses     ICD-10-CM   1. Acute pain of right knee  M25.561       ED Discharge Orders          Ordered    VAS [place] LOWER EXTREMITY VENOUS (DVT)  Status:  Canceled        04/29/23 0357    LE VENOUS        04/29/23 0358             Discharge Instructions Discussed with and Provided to Patient:    Discharge Instructions      You were evaluated in the Emergency Department and after careful evaluation, we did not find any emergent condition requiring admission or further testing in the hospital.  Your exam/testing today was overall reassuring.  Suspect sprain of the knee.   Continue the Naprosyn twice daily for pain.  We recommend return for ultrasound to further evaluate the swelling behind the knee.  Please return to the Emergency Department if you experience any worsening of your condition.  Thank you for allowing [place] to be a part of your care.       Sabas Sous, MD 04/29/23 [id number]

## 2023-04-29 NOTE — Discharge Instructions (Addendum)
You were evaluated in the Emergency Department and after careful evaluation, we did not find any emergent condition requiring admission or further testing in the hospital.  Your exam/testing today was overall reassuring.  Suspect sprain of the knee.  Continue the Naprosyn twice daily for pain.  We recommend return for ultrasound to further evaluate the swelling behind the knee.  Please return to the Emergency Department if you experience any worsening of your condition.  Thank you for allowing [place] to be a part of your care.

## 2023-04-30 ENCOUNTER — Ambulatory Visit (HOSPITAL_COMMUNITY)
Admission: RE | Admit: 2023-04-30 | Discharge: 2023-04-30 | Disposition: A | Discharge status: Home / Self Care | Payer: No Typology Code available for payment source | Source: Ambulatory Visit | Attending: Emergency Medicine | Admitting: Emergency Medicine

## 2023-04-30 DIAGNOSIS — M4316 Spondylolisthesis, lumbar region: Secondary | ICD-10-CM | POA: Diagnosis not present

## 2023-04-30 DIAGNOSIS — M25569 Pain in unspecified knee: Secondary | ICD-10-CM | POA: Diagnosis present

## 2023-04-30 DIAGNOSIS — M1990 Unspecified osteoarthritis, unspecified site: Secondary | ICD-10-CM | POA: Diagnosis not present

## 2023-04-30 DIAGNOSIS — M25561 Pain in right knee: Secondary | ICD-10-CM

## 2023-04-30 NOTE — Progress Notes (Signed)
VASCULAR LAB    Right lower extremity venous duplex has been performed.  See CV proc for preliminary results.   Salvador Coupe, RVT 04/30/2023, 2:29 PM

## 2024-04-30 NOTE — Progress Notes (Unsigned)
 [facility] [address] [place] [address] Phone: [phone] Subjective:   Abigail Reynolds, am serving as a scribe for Dr. Arthea Reynolds.  I'm seeing this patient by the request  of:  Clinic, [facility]  CC: Right knee pain  YEP:Dlagzrupcz  Abigail Reynolds Abigail Reynolds is a 48 y.o. female coming in with complaint of R knee pain.  Seen in the emergency room November 29.  Patient states had a injection about 3 months ago. Starts PT soon and also goes back to [place]. They talk about gel. Second opinion on status of knee. Just finished a prednisone taper for hip pain.   Xray R knee 04/27/2024 IMPRESSION: No acute bony abnormalities.   Have not seen the patient since 2023.  Has had Achilles tendon pain as well as hypermobility.  Had been in physical therapy a significant amount since we have seen patient had a posterior lumbar interbody fusion    Past Medical History:  Diagnosis Date   Arthritis    Depression    GERD (gastroesophageal reflux disease)    Headache    PTSD (post-traumatic stress disorder)    Seizures (HCC)    FOCAL SEIZURES  NO IN 2 YEARS last sezuire over 5 years ago per pt   Past Surgical History:  Procedure Laterality Date   BACK SURGERY     L4 AND L5 2014   CERVICAL SPINE SURGERY     C 4 AND C5   COLONOSCOPY     DILATION AND CURETTAGE OF UTERUS     DILITATION & CURRETTAGE/HYSTROSCOPY WITH HYDROTHERMAL ABLATION N/A 01/01/2014   Procedure: DILATATION & CURETTAGE/HYSTEROSCOPY WITH attempted HYDROTHERMAL ABLATION;  Surgeon: Aida DELENA Na, MD;  Location: [facility];  Service: Gynecology;  Laterality: N/A;   LASIK     TONSILLECTOMY     TUBAL LIGATION     Social History   Socioeconomic History   Marital status: Married    Spouse name: Not on file   Number of children: Not on file   Years of education: Not on file   Highest education level: Not on file  Occupational History   Not on file  Tobacco Use   Smoking status:  Never   Smokeless tobacco: Never  Vaping Use   Vaping status: Never Used  Substance and Sexual Activity   Alcohol use: Yes    Alcohol/week: 4.0 - 7.0 standard drinks of alcohol    Types: 4 - 7 Glasses of wine per week    Comment: sometimes drinks wine 1 glass every night then may not drink for weeks-Social   Drug use: No   Sexual activity: Not on file  Other Topics Concern   Not on file  Social History Narrative   Not on file   Social Drivers of Health   Financial Resource Strain: Not on file  Food Insecurity: No Food Insecurity (06/04/2022)   Hunger Vital Sign    Worried About Running Out of Food in the Last Year: Never true    Ran Out of Food in the Last Year: Never true  Transportation Needs: No Transportation Needs (06/04/2022)   PRAPARE - Administrator, Civil Service (Medical): No    Lack of Transportation (Non-Medical): No  Physical Activity: Not on file  Stress: Not on file  Social Connections: Not on file   Allergies  Allergen Reactions   Latex Hives and Itching   Morphine Hives and Itching   Vicodin [Hydrocodone-Acetaminophen ] Hives, Itching and  Rash   No family history on file.    Current Outpatient Medications (Respiratory):    cetirizine (ZYRTEC) 10 MG tablet, Take 10 mg by mouth daily.   promethazine  (PHENERGAN ) 25 MG tablet, Take 25 mg by mouth every 6 (six) hours as needed for nausea or vomiting.  Current Outpatient Medications (Analgesics):    naproxen (NAPROSYN) 500 MG tablet, Take 500 mg by mouth 2 (two) times daily as needed for moderate pain.   SUMAtriptan  (IMITREX ) 100 MG tablet, Take 100 mg by mouth every 2 (two) hours as needed for migraine. May repeat in 2 hours if headache persists or recurs.  Current Outpatient Medications (Hematological):    cyanocobalamin (VITAMIN B12) 1000 MCG tablet, Take 1,000 mcg by mouth daily.  Current Outpatient Medications (Other):    Calcium Carb-Cholecalciferol (CALCIUM + VITAMIN D3 PO), Take 1 tablet  by mouth daily.   COLLAGEN PO, Take 1 capsule by mouth daily.   cyclobenzaprine  (FLEXERIL ) 10 MG tablet, Take 10 mg by mouth at bedtime.   cyclobenzaprine  (FLEXERIL ) 10 MG tablet, Take 1 tablet (10 mg total) by mouth 3 (three) times daily as needed for muscle spasms.   diclofenac Sodium (VOLTAREN) 1 % GEL, Apply 1 Application topically 3 (three) times daily as needed (joint pain).   famotidine  (PEPCID ) 40 MG tablet, Take 40 mg by mouth every evening.   Flaxseed, Linseed, (FLAXSEED OIL) 1000 MG CAPS, Take 1,000 mg by mouth daily.   FLUoxetine  (PROZAC ) 10 MG capsule, Take 30 mg by mouth daily.   gabapentin  (NEURONTIN ) 300 MG capsule, Take 1 capsule (300 mg total) by mouth 3 (three) times daily.   Olopatadine HCl (EYE ALLERGY ITCH RELIEF) 0.2 % SOLN, Place 1 drop into both eyes as needed (itchy eyes).   traZODone  (DESYREL ) 50 MG tablet, Take 50 mg by mouth at bedtime.   vitamin E 180 MG (400 UNITS) capsule, Take 400 Units by mouth daily.   zonisamide  (ZONEGRAN ) 100 MG capsule, Take 400 mg by mouth at bedtime.   Reviewed prior external information including notes and imaging from  primary care provider As well as notes that were available from care everywhere and other healthcare systems.  Past medical history, social, surgical and family history all reviewed in electronic medical record.  No pertanent information unless stated regarding to the chief complaint.   Review of Systems:  No headache, visual changes, nausea, vomiting, diarrhea, constipation, dizziness, abdominal pain, skin rash, fevers, chills, night sweats, weight loss, swollen lymph nodes, body aches, joint swelling, chest pain, shortness of breath, mood changes. POSITIVE muscle aches  Objective  Blood pressure 106/68, pulse 83, height 5' 6 (1.676 m), weight 189 lb (85.7 kg), last menstrual period 09/29/2014, SpO2 98%.   General: No apparent distress alert and oriented x3 mood and affect normal, dressed appropriately.  HEENT:  Pupils equal, extraocular movements intact  Respiratory: Patient's speak in full sentences and does not appear short of breath  Cardiovascular: No lower extremity edema, non tender, no erythema  Right knee exam shows patient does have a positive McMurray's sign noted.  Mild crepitus noted.  Lateral tracking of the kneecap.  Some atrophy of the VMO compared to the contralateral side.  Fullness noted in the popliteal area.  Limited muscular skeletal ultrasound was performed and interpreted by Reynolds HUSSAR, M  Shows the patient does have a Baker's cyst noted.  He does have some narrowing of the patellofemoral joint that is consistent with arthritis.  No loose bodies noted.  Lateral meniscal tear  is noted.  No significant displacement noted.  Procedure: Real-time Ultrasound Guided Injection of aspiration of right Baker's cyst Device: GE Logiq Q7 Ultrasound guided injection is preferred based studies that show increased duration, increased effect, greater accuracy, decreased procedural pain, increased response rate, and decreased cost with ultrasound guided versus blind injection.  Verbal informed consent obtained.  Time-out conducted.  Noted no overlying erythema, induration, or other signs of local infection.  Skin prepped in a sterile fashion.  Local anesthesia: Topical Ethyl chloride.  With sterile technique and under real time ultrasound guidance: With a 21-gauge 2 inch needle injected with 0.5 cc of 0.5% Marcaine  and aspirated 10 cc of straw-colored fluid then 0.5 cc of Kenalog 40 mg/mL. Completed without difficulty  Pain immediately resolved suggesting accurate placement of the medication.  Advised to call if fevers/chills, erythema, induration, drainage, or persistent bleeding.  Images saved Impression: Technically successful ultrasound guided injection.     Impression and Recommendations:     The above documentation has been reviewed and is accurate and complete Hari Casaus M Sharmila Wrobleski,  DO

## 2024-05-02 ENCOUNTER — Other Ambulatory Visit: Payer: Self-pay

## 2024-05-02 ENCOUNTER — Ambulatory Visit: Payer: Self-pay | Admitting: Family Medicine

## 2024-05-02 ENCOUNTER — Encounter: Payer: Self-pay | Admitting: Family Medicine

## 2024-05-02 VITALS — BP 106/68 | HR 83 | Ht 66.0 in | Wt 189.0 lb

## 2024-05-02 DIAGNOSIS — M7121 Synovial cyst of popliteal space [Baker], right knee: Secondary | ICD-10-CM | POA: Insufficient documentation

## 2024-05-02 DIAGNOSIS — M1710 Unilateral primary osteoarthritis, unspecified knee: Secondary | ICD-10-CM

## 2024-05-02 NOTE — Assessment & Plan Note (Signed)
 Patellofemoral arthritis noted previously.  We discussed icing regimen and home exercises, discussed which activities to do and which ones to avoid.  Increase activity slowly.  We discussed Tru pull lite.  Discussed also has what appears to be more of a lateral meniscus noted.  Discussed avoiding twisting motions initially.  Follow-up with me again in 6 to 8 weeks otherwise.

## 2024-05-02 NOTE — Patient Instructions (Addendum)
 Drained Bakers Cyst Good to see you! PT work on [employer] at end of long day Avoid twisting motion See you again in 3 months

## 2024-05-02 NOTE — Assessment & Plan Note (Signed)
 Aspiration done today, discussed with patient about icing regimen and home exercises, discussed which activities to do and which ones to avoid.  Increase activity slowly.  Discussed icing regimen.  Follow-up again in 6 to 12 weeks

## 2024-07-31 ENCOUNTER — Ambulatory Visit: Admitting: Family Medicine
